# Patient Record
Sex: Female | Born: 2000 | Race: White | Hispanic: No | Marital: Single | Smoking: Never smoker
Health system: Southern US, Community
[De-identification: ages and names within clinical notes are randomized; demographics above are authoritative.]

## PROBLEM LIST (undated history)

## (undated) DIAGNOSIS — R1900 Intra-abdominal and pelvic swelling, mass and lump, unspecified site: Secondary | ICD-10-CM

## (undated) DIAGNOSIS — G43909 Migraine, unspecified, not intractable, without status migrainosus: Secondary | ICD-10-CM

## (undated) DIAGNOSIS — C801 Malignant (primary) neoplasm, unspecified: Secondary | ICD-10-CM

## (undated) HISTORY — DX: Migraine, unspecified, not intractable, without status migrainosus: G43.909

## (undated) HISTORY — DX: Intra-abdominal and pelvic swelling, mass and lump, unspecified site: R19.00

---
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
## 2018-01-05 DIAGNOSIS — H5203 Hypermetropia, bilateral: Secondary | ICD-10-CM | POA: Diagnosis not present

## 2018-02-10 ENCOUNTER — Other Ambulatory Visit: Payer: Self-pay | Admitting: Gynecologic Oncology

## 2018-02-10 ENCOUNTER — Telehealth: Payer: Self-pay

## 2018-02-10 DIAGNOSIS — Z30011 Encounter for initial prescription of contraceptive pills: Secondary | ICD-10-CM

## 2018-02-10 DIAGNOSIS — C562 Malignant neoplasm of left ovary: Secondary | ICD-10-CM

## 2018-02-10 NOTE — Telephone Encounter (Signed)
Received pt's VM regarding when she should follow up with Dr Denman George and when she should have the Cancer Marker B-HCG repeated.  Reviewed Dr Denman George notes and notified Joylene John NP. Per notes and Melissa NP, pt scheduled for f/u this month with labs placed by [facility] NP.  Called pt with Appt for lab (3/8) and f/u on 3/11.  Per notes and Melissa NP- recommend birth control pills started.  Pt verbalized she has used any birth control or condom but has not been sexually active, would like to start BCP but hasn't seen OB/Gyn yet to get BCP prescription. Per Lenna Sciara NP, obtained pt's pharmacy preference [facility] on [address] and informed pt to pick up this prescription and start. Informed pt of risk for blood clot with use of birth control pills, signs/symptoms to watch for given to pt ie shortness of breath, redness, warmth, tender leg, and pain with standing and she voiced understanding.

## 2018-02-11 MED ORDER — NORETHINDRONE-ETH ESTRADIOL 1-35 MG-MCG PO TABS: 1.0000 | ORAL_TABLET | Freq: Every day | ORAL | 11 refills | Status: DC

## 2018-02-11 NOTE — Progress Notes (Signed)
Patient informed of possible side effects of taking BCPs by [facility] RN including the risk for blood clots.

## 2018-02-12 ENCOUNTER — Inpatient Hospital Stay: Payer: 59 | Attending: Hematology and Oncology

## 2018-02-12 DIAGNOSIS — Z9221 Personal history of antineoplastic chemotherapy: Secondary | ICD-10-CM | POA: Insufficient documentation

## 2018-02-12 DIAGNOSIS — C562 Malignant neoplasm of left ovary: Secondary | ICD-10-CM | POA: Insufficient documentation

## 2018-02-12 DIAGNOSIS — C786 Secondary malignant neoplasm of retroperitoneum and peritoneum: Secondary | ICD-10-CM | POA: Insufficient documentation

## 2018-02-12 LAB — CBC WITH DIFFERENTIAL/PLATELET
BASOS ABS: 0 10*3/uL (ref 0.0–0.1)
Basophils Relative: 1 %
Eosinophils Absolute: 0.4 10*3/uL (ref 0.0–0.5)
Eosinophils Relative: 9 %
HEMATOCRIT: 36.9 % (ref 34.8–46.6)
HEMOGLOBIN: 12.1 g/dL (ref 11.6–15.9)
LYMPHS PCT: 31 %
Lymphs Abs: 1.3 10*3/uL (ref 0.9–3.3)
MCH: 29.2 pg (ref 25.1–34.0)
MCHC: 32.8 g/dL (ref 31.5–36.0)
MCV: 88.9 fL (ref 79.5–101.0)
Monocytes Absolute: 0.3 10*3/uL (ref 0.1–0.9)
Monocytes Relative: 8 %
NEUTROS ABS: 2.1 10*3/uL (ref 1.5–6.5)
NEUTROS PCT: 51 %
Platelets: 240 10*3/uL (ref 145–400)
RBC: 4.14 MIL/uL (ref 3.70–5.45)
RDW: 14.4 % (ref 11.2–14.5)
WBC: 4.2 10*3/uL (ref 3.9–10.3)

## 2018-02-12 LAB — COMPREHENSIVE METABOLIC PANEL
ALT: 16 U/L (ref 0–55)
ANION GAP: 7 (ref 3–11)
AST: 21 U/L (ref 5–34)
Albumin: 3.7 g/dL (ref 3.5–5.0)
Alkaline Phosphatase: 93 U/L (ref 40–150)
BUN: 13 mg/dL (ref 7–26)
CHLORIDE: 107 mmol/L (ref 98–109)
CO2: 27 mmol/L (ref 22–29)
Calcium: 9.5 mg/dL (ref 8.4–10.4)
Creatinine, Ser: 0.82 mg/dL (ref 0.60–1.10)
Glucose, Bld: 68 mg/dL — ABNORMAL LOW (ref 70–140)
POTASSIUM: 4.5 mmol/L (ref 3.5–5.1)
Sodium: 141 mmol/L (ref 136–145)
Total Bilirubin: 0.7 mg/dL (ref 0.2–1.2)
Total Protein: 6.7 g/dL (ref 6.4–8.3)

## 2018-02-12 LAB — LACTATE DEHYDROGENASE: LDH: 165 U/L (ref 125–245)

## 2018-02-13 LAB — BETA HCG QUANT (REF LAB): hCG Quant: <1 m[IU]/mL

## 2018-02-15 ENCOUNTER — Other Ambulatory Visit: Payer: 59

## 2018-02-15 ENCOUNTER — Encounter: Payer: Self-pay | Admitting: Gynecologic Oncology

## 2018-02-15 ENCOUNTER — Inpatient Hospital Stay (HOSPITAL_BASED_OUTPATIENT_CLINIC_OR_DEPARTMENT_OTHER): Payer: 59 | Admitting: Gynecologic Oncology

## 2018-02-15 VITALS — BP 106/56 | HR 70 | Temp 97.8°F | Resp 18 | Ht 65.0 in | Wt 166.3 lb

## 2018-02-15 DIAGNOSIS — C786 Secondary malignant neoplasm of retroperitoneum and peritoneum: Secondary | ICD-10-CM

## 2018-02-15 DIAGNOSIS — Z9221 Personal history of antineoplastic chemotherapy: Secondary | ICD-10-CM

## 2018-02-15 DIAGNOSIS — C562 Malignant neoplasm of left ovary: Secondary | ICD-10-CM

## 2018-02-15 DIAGNOSIS — T451X5A Adverse effect of antineoplastic and immunosuppressive drugs, initial encounter: Secondary | ICD-10-CM

## 2018-02-15 DIAGNOSIS — G620 Drug-induced polyneuropathy: Secondary | ICD-10-CM

## 2018-02-15 DIAGNOSIS — D701 Agranulocytosis secondary to cancer chemotherapy: Secondary | ICD-10-CM

## 2018-02-15 DIAGNOSIS — D696 Thrombocytopenia, unspecified: Secondary | ICD-10-CM

## 2018-02-15 MED ORDER — NORGESTIMATE-ETH ESTRADIOL 0.25-35 MG-MCG PO TABS: 1.0000 | ORAL_TABLET | Freq: Every day | ORAL | 11 refills | Status: DC

## 2018-02-15 NOTE — Progress Notes (Signed)
Follow-up Note: Gyn-Onc  Consult was requested by Avon Gully, NP for the evaluation of Mandy Bauer 17 y.o. female  CC:  Chief Complaint  Patient presents with  . Dysgerminoma of left ovary [facility])    Assessment/Plan:  Mandy Bauer  is a 17 y.o.  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 Completed therapy December, 2018.  No evidence of disease on exam or tumor markers.  Will continue surveillance with 3 monthly tumor marker assessments (HCG and LDH). Prescribed OCP use while in surveillance period to avoid pregnancy as cause of elevated HCG.  Will establish a provider in [place] to follow her during the school year.   HPI: Mandy Bauer is a 17 year old P0 who was seen in consultation at the request of Avon Gully, NP for a large pelvic mass.  The patient is otherwise very healthy. 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  In May, 2018 with her menstrual period she experienced severe flushing, headaches, palpitations, nausea. This recurred again in June and she was seen at an urgent care. She was recommended to see her OBGYN about starting OCP's for possible menstrual migraines. Her menstrual period was otherwise normal.  She saw Avon Gully, NP on 06/08/17 and a mass was felt on pelvic examination. This prompted a TVUS to be performed. A large heterogeneous mass was noted extending from the posterior aspect of the uterus to above the umbilicus. Unable to decifer place of origin. Separate ovaries not seen. Increased vascularity noted within. Area measuring 21.5x15x12.7cm.    She then received an MRI of the abdomen and pelvis on 06/12/17 which revealed a small splenule along medial spleen. She also  had a 104m and 11 mm left periaortic lymph nodes identified that were mildly enlarged.  Within the pelvis and extending to the lower abdomen was a 17.8x15.5x7.5cm lobular intermediate T2 signal mass. The mass demonstrated heterogeneous enhancement centrally. No definite fat plane was seen between the mass and posterior uterus. The right ovary is identified and appeared separate to the mass measuring 5cm. The left ovary could not definitively be seen. There is a moderate amount of free fluid in the pelvis and additionally, there were a few enhancing nodules measuring up to 1.9cm in the cul de sac.  CEA was 1.5 CA 125 was 137  Alk Phos was elevated at 196 AFP was 1.2 (normal) HCG was elevated at 344 on 06/16/17 LDH was elevated at 634 on 06/16/17  On 06/18/17 she underwent an exploratory laparotomy, partial omentectomy, biopsy of left ovarian mass. Intraoperative findings included a 20+ cm solid left ovarian tumor densely adherent to the posterior uterus (infiltrating into the uterus/myometrium) with omentum attached anteriorally, mobile but enlarged left PA nodes, no other gross intraperitoneal disease. Due to the inability to resect the mass and preserve uterine fertility a decision was made to biopsy the mass. A segment of omentum that was adherent to the tumor was also removed.  Final pathology confirmed a mixed germ cell tumor with 98% dysgerminoma and 2% yolk sac tumor. The omentum was positive for nodules (microscopic) germ cell tumor. The washings/ascites were positive.  PET/CT on 06/26/17 showed very large pelvic mass compatible with germ cell neoplasm is again  identified and exhibits intense heterogeneity FDG uptake at least 1 area of peritoneal nodularity is identified. There is also ascites within the lower abdomen and pelvis. Suspicious for peritoneal metastases. Borderline enlarged periaortic lymph nodes. Retroperitoneal nodal metastasis cannot be excluded. No evidence for osseous metastasis  or metastatic disease to the neck or chest. MRI brain was negative for metastases.  Her case was discussed at multidisciplinary conference and she was recommended to receive 3-4 cycles of BEP chemotherapy prior to reoperation and resection of the left ovary, omentum, nodes.  Cycle 1 of BEP was administered on 07/14/17. Cycle 2 was adminstered on 8/28-08/08/17. Cycle 3 was administered on 08/25/18.  She tolerated therapy well though had some acquired pancytopenia and neutropenia.  MRI on 09/16/17 showed: Resolution of hydronephrosis. Small left periaortic nodes remain, no pelvic sidewall adenopathy. Significant decrease in size of left pelvic mass, measuring 7.9x7.8x5.8cm. It is again intimately associated with the posterior uterine body and fundus. No dominant right ovarian mass.  No omental nodules. New complexity within the cul de sac fluid.   On 10/08/17 she underwent an interval surgery with ex lap, omentectomy, left salpingo-ophorectomy. The ovarian mass measured 11.5cm and was densely adherent to the posterior uterus. However, it was able to be freed from the uterine body without entry into the endometrial cavity. The right ovary was grossly normal and covered with filmy adhesions. There was no gross residual disease remaining at the completion of surgery.  Final pathology revealed only microscopic foci of viable yolk sac tumor <0.1cm in the left ovary. The omentum was negative.  She received 1 additional cycle of BEP (cycle 4) on 11/09/17.   Since surgery she has remained disease free.   Interval Hx:  Tumor markers were normal (HCG undetectable on 08/24/17 - prior to debulking surgery) and continued <1 s/p chemotherapy on 11/20/17.  HCG was <1 on 02/12/18 LDH was 165 (normal) on 02/12/18  The patient has returned to horseback riding. She has deferred college until the fall of 2019. She will go to school near [place].    Current Meds:  Outpatient Encounter Medications as of 02/15/2018   Medication Sig  . acetaminophen (TYLENOL) 500 MG tablet Take 1 tablet (500 mg total) by mouth every 6 (six) hours as needed.  Marland Kitchen ibuprofen (ADVIL,MOTRIN) 400 MG tablet Take 2 tablets (800 mg total) by mouth every 6 (six) hours as needed for fever.  . lidocaine-prilocaine (EMLA) cream Apply 1 application topically as needed. (Patient not taking: Reported on 02/15/2018)  . norethindrone-ethinyl estradiol 1/35 (ORTHO-NOVUM, NORTREL,CYCLAFEM) tablet Take 1 tablet by mouth daily. (Patient not taking: Reported on 02/15/2018)  . norgestimate-ethinyl estradiol (ORTHO-CYCLEN, 28,) 0.25-35 MG-MCG tablet Take 1 tablet by mouth daily.  . [DISCONTINUED] loratadine-pseudoephedrine (CLARITIN-D 12-HOUR) 5-120 MG tablet Take 1 tablet by mouth every 12 (twelve) hours as needed for allergies.  . [DISCONTINUED] LORazepam (ATIVAN) 0.5 MG tablet Take 1 tablet 4 times a day as needed for nausea  . [DISCONTINUED] magic mouthwash w/lidocaine SOLN Take 5 mLs by mouth 4 (four) times daily. Gargle and spit (Patient taking differently: Take 5 mLs by mouth 4 (four) times daily as needed for mouth pain. Gargle and spit)  . [DISCONTINUED] ondansetron (ZOFRAN) 8 MG tablet Take 1 tablet (8 mg total) by mouth every 8 (eight) hours as needed for nausea.  . [DISCONTINUED] oxyCODONE (OXY IR/ROXICODONE) 5 MG immediate release tablet Take 1 tablet (5 mg total) by mouth every 4 (four) hours as needed for moderate pain or breakthrough pain. (Patient  not taking: Reported on 11/20/2017)  . [DISCONTINUED] prochlorperazine (COMPAZINE) 10 MG tablet Take 1 tablet (10 mg total) by mouth every 6 (six) hours as needed for nausea or vomiting.  . [DISCONTINUED] senna (SENOKOT) 8.6 MG TABS tablet Take 1 tablet (8.6 mg total) by mouth at bedtime. (Patient not taking: Reported on 11/20/2017)   No facility-administered encounter medications on file as of 02/15/2018.     Allergy: No Known Allergies  Social Hx:   Social History   Socioeconomic History   . Marital status: Single    Spouse name: Not on file  . Number of children: 0  . Years of education: Not on file  . Highest education level: Not on file  Social Needs  . Financial resource strain: Not on file  . Food insecurity - worry: Not on file  . Food insecurity - inability: Not on file  . Transportation needs - medical: Not on file  . Transportation needs - non-medical: Not on file  Occupational History  . Occupation: Ship broker  Tobacco Use  . Smoking status: Never Smoker  . Smokeless tobacco: Never Used  Substance and Sexual Activity  . Alcohol use: No  . Drug use: No  . Sexual activity: Yes  Other Topics Concern  . Not on file  Social History Narrative  . Not on file    Past Surgical Hx:  Past Surgical History:  Procedure Laterality Date  . DEBULKING N/A 10/08/2017   Procedure: RADICAL TUMOR DEBULKING;  Surgeon: Everitt Amber, MD;  Location: [facility];  Service: Gynecology;  Laterality: N/A;  . IR FLUORO GUIDE PORT INSERTION RIGHT  06/29/2017  . IR REMOVAL TUN ACCESS W/ PORT W/O FL MOD SED  12/21/2017  . IR US GUIDE VASC ACCESS RIGHT  06/29/2017  . LAPAROTOMY N/A 06/18/2017   Procedure: EXPLORATORY LAPAROTOMY, BIOPSY OF PELVIC MASS;  Surgeon: Everitt Amber, MD;  Location: [facility];  Service: Gynecology;  Laterality: N/A;  . LAPAROTOMY N/A 10/08/2017   Procedure: EXPLORATORY LAPAROTOMY; OMENTECTOMY;  Surgeon: Everitt Amber, MD;  Location: [facility];  Service: Gynecology;  Laterality: N/A;  . SALPINGOOPHORECTOMY Left 10/08/2017   Procedure: LEFT SALPINGO OOPHORECTOMY;  Surgeon: Everitt Amber, MD;  Location: [facility];  Service: Gynecology;  Laterality: Left;  . TONSILLECTOMY  2005    Past Medical Hx:  Past Medical History:  Diagnosis Date  . Migraine   . Pelvic mass in female     Past Gynecological History:  Sexually active. G0. Never had pap. No LMP recorded. Patient has had an injection.  Family Hx:  Family History  Problem Relation Age of Onset  . Hypertension Maternal  Grandmother   . Heart disease Maternal Grandmother   . Diabetes Maternal Grandfather   . Lymphoma Maternal Grandfather 34       non-Hodgkin lymphoma    Review of Systems:  Constitutional  Feels well,   Occasional flushing. ENT Normal appearing ears and nares bilaterally Skin/Breast  No rash, sores, jaundice, itching, dryness Cardiovascular  No chest pain, shortness of breath, or edema  Pulmonary  No cough or wheeze.  Gastro Intestinal  No nausea, vomitting, or diarrhoea. No bright red blood per rectum, no abdominal pain, change in bowel movement, or constipation.  Genito Urinary  No frequency, urgency, dysuria, No abnormal bleeding Musculo Skeletal  No myalgia, arthralgia, joint swelling or pain  Neurologic  No weakness, numbness, change in gait,  Psychology  No depression, anxiety, insomnia.   Vitals:  Blood pressure (!) 106/56, pulse 70, temperature 97.8  F (36.6 C), temperature source Oral, resp. rate 18, height 5' 5"  (1.651 m), weight 166 lb 4.8 oz (75.4 kg), SpO2 100 %.  Physical Exam: WD in NAD Neck  Supple NROM, without any enlargements.  Lymph Node Survey No cervical supraclavicular or inguinal adenopathy Cardiovascular  Pulse normal rate, regularity and rhythm. S1 and S2 normal.  Lungs  Clear to auscultation bilateraly, without wheezes/crackles/rhonchi. Good air movement.  Skin  No rash/lesions/breakdown  Psychiatry  Alert and oriented to person, place, and time  Abdomen  Normoactive bowel sounds, abdomen soft, non-tender and thin without evidence of hernia. Mass not palpable.  Back No CVA tenderness Genito Urinary: normal external female genitalia. Normal vaginal and cervix. Small mobile uterus and no palpable ovarian masses or cul de sac nodularity.  Rectal  deferred  Extremities  No bilateral cyanosis, clubbing or edema.   Thereasa Solo, MD  02/15/2018, 2:49 PM

## 2018-02-15 NOTE — Patient Instructions (Signed)
Please return to see Dr Denman George in 3 months as scheduled. She will test labs before that visit.  Dr Denman George has prescribed a new birth control pill to take daily (ortho cyclen). Take this instead of (not in addition) to the ortho novum that was previously prescribed.  Please notify Dr Denman George at phone number [phone] if you notice vaginal bleeding, new pelvic or abdominal pains, bloating, feeling full easy, or a change in bladder or bowel function.

## 2018-03-26 ENCOUNTER — Other Ambulatory Visit: Payer: Self-pay

## 2018-03-26 ENCOUNTER — Emergency Department (HOSPITAL_COMMUNITY): Payer: 59

## 2018-03-26 ENCOUNTER — Encounter (HOSPITAL_COMMUNITY): Payer: Self-pay | Admitting: Emergency Medicine

## 2018-03-26 ENCOUNTER — Emergency Department (HOSPITAL_COMMUNITY)
Admission: EM | Admit: 2018-03-26 | Discharge: 2018-03-26 | Disposition: A | Discharge status: Home / Self Care | Payer: 59 | Attending: Emergency Medicine | Admitting: Emergency Medicine

## 2018-03-26 DIAGNOSIS — S8991XA Unspecified injury of right lower leg, initial encounter: Secondary | ICD-10-CM | POA: Diagnosis not present

## 2018-03-26 DIAGNOSIS — M25461 Effusion, right knee: Secondary | ICD-10-CM | POA: Diagnosis not present

## 2018-03-26 DIAGNOSIS — Z79899 Other long term (current) drug therapy: Secondary | ICD-10-CM | POA: Insufficient documentation

## 2018-03-26 DIAGNOSIS — M25561 Pain in right knee: Secondary | ICD-10-CM
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
## 2018-03-26 NOTE — Discharge Instructions (Addendum)
Ice and elevate your knee. Use immobilizer. Use crutches as needed. Ibuprofen or tylenol for pain. Follow up with either Dr. Ninfa Linden or an orthopedist of your choice.

## 2018-03-30 DIAGNOSIS — M25461 Effusion, right knee: Secondary | ICD-10-CM | POA: Diagnosis not present

## 2018-03-30 DIAGNOSIS — M25561 Pain in right knee: Secondary | ICD-10-CM | POA: Diagnosis not present

## 2018-04-02 DIAGNOSIS — S83231A Complex tear of medial meniscus, current injury, right knee, initial encounter: Secondary | ICD-10-CM | POA: Diagnosis not present

## 2018-04-02 DIAGNOSIS — S83511A Sprain of anterior cruciate ligament of right knee, initial encounter: Secondary | ICD-10-CM | POA: Diagnosis not present

## 2018-04-23 ENCOUNTER — Telehealth: Payer: Self-pay | Admitting: *Deleted

## 2018-04-23 NOTE — Telephone Encounter (Signed)
Faxed [facility] to May; release [phone]

## 2018-04-23 NOTE — Telephone Encounter (Signed)
Faxed [facility] to

## 2018-04-26 DIAGNOSIS — S83511A Sprain of anterior cruciate ligament of right knee, initial encounter: Secondary | ICD-10-CM | POA: Diagnosis not present

## 2018-04-26 DIAGNOSIS — G8918 Other acute postprocedural pain: Secondary | ICD-10-CM | POA: Diagnosis not present

## 2018-04-26 DIAGNOSIS — S83231A Complex tear of medial meniscus, current injury, right knee, initial encounter: Secondary | ICD-10-CM | POA: Diagnosis not present

## 2018-04-26 DIAGNOSIS — S83211A Bucket-handle tear of medial meniscus, current injury, right knee, initial encounter: Secondary | ICD-10-CM | POA: Diagnosis not present

## 2018-04-26 DIAGNOSIS — M25361 Other instability, right knee: Secondary | ICD-10-CM | POA: Diagnosis not present

## 2018-05-04 DIAGNOSIS — M25661 Stiffness of right knee, not elsewhere classified: Secondary | ICD-10-CM | POA: Diagnosis not present

## 2018-05-07 DIAGNOSIS — M25661 Stiffness of right knee, not elsewhere classified: Secondary | ICD-10-CM | POA: Diagnosis not present

## 2018-05-12 DIAGNOSIS — M25661 Stiffness of right knee, not elsewhere classified: Secondary | ICD-10-CM | POA: Diagnosis not present

## 2018-05-13 ENCOUNTER — Inpatient Hospital Stay: Payer: 59 | Attending: Hematology and Oncology

## 2018-05-13 DIAGNOSIS — Z9221 Personal history of antineoplastic chemotherapy: Secondary | ICD-10-CM | POA: Insufficient documentation

## 2018-05-13 DIAGNOSIS — Z8543 Personal history of malignant neoplasm of ovary: Secondary | ICD-10-CM | POA: Diagnosis not present

## 2018-05-13 DIAGNOSIS — D701 Agranulocytosis secondary to cancer chemotherapy: Secondary | ICD-10-CM

## 2018-05-13 DIAGNOSIS — T451X5A Adverse effect of antineoplastic and immunosuppressive drugs, initial encounter: Secondary | ICD-10-CM

## 2018-05-13 DIAGNOSIS — Z90721 Acquired absence of ovaries, unilateral: Secondary | ICD-10-CM | POA: Insufficient documentation

## 2018-05-13 DIAGNOSIS — C562 Malignant neoplasm of left ovary: Secondary | ICD-10-CM

## 2018-05-13 LAB — CBC WITH DIFFERENTIAL (CANCER CENTER ONLY)
BASOS PCT: 1 %
Basophils Absolute: 0.1 10*3/uL (ref 0.0–0.1)
Eosinophils Absolute: 0.1 10*3/uL (ref 0.0–0.5)
Eosinophils Relative: 2 %
HEMATOCRIT: 38.3 % (ref 34.8–46.6)
Hemoglobin: 13.2 g/dL (ref 11.6–15.9)
Lymphocytes Relative: 33 %
Lymphs Abs: 1.9 10*3/uL (ref 0.9–3.3)
MCH: 30.5 pg (ref 25.1–34.0)
MCHC: 34.5 g/dL (ref 31.5–36.0)
MCV: 88.4 fL (ref 79.5–101.0)
MONO ABS: 0.3 10*3/uL (ref 0.1–0.9)
Monocytes Relative: 6 %
NEUTROS ABS: 3.3 10*3/uL (ref 1.5–6.5)
Neutrophils Relative %: 58 %
PLATELETS: 252 10*3/uL (ref 145–400)
RBC: 4.33 MIL/uL (ref 3.70–5.45)
RDW: 12.8 % (ref 11.2–14.5)
WBC Count: 5.7 10*3/uL (ref 3.9–10.3)

## 2018-05-13 LAB — LACTATE DEHYDROGENASE: LDH: 169 U/L (ref 125–245)

## 2018-05-14 DIAGNOSIS — M25661 Stiffness of right knee, not elsewhere classified: Secondary | ICD-10-CM | POA: Diagnosis not present

## 2018-05-14 LAB — BETA HCG QUANT (REF LAB): hCG Quant: <1 m[IU]/mL

## 2018-05-18 DIAGNOSIS — M25661 Stiffness of right knee, not elsewhere classified: Secondary | ICD-10-CM | POA: Diagnosis not present

## 2018-05-19 ENCOUNTER — Encounter: Payer: Self-pay | Admitting: Gynecologic Oncology

## 2018-05-19 ENCOUNTER — Inpatient Hospital Stay (HOSPITAL_BASED_OUTPATIENT_CLINIC_OR_DEPARTMENT_OTHER): Payer: 59 | Admitting: Gynecologic Oncology

## 2018-05-19 VITALS — BP 106/69 | HR 86 | Temp 97.5°F | Resp 20 | Ht 66.0 in | Wt 162.0 lb

## 2018-05-19 DIAGNOSIS — Z8543 Personal history of malignant neoplasm of ovary: Secondary | ICD-10-CM | POA: Diagnosis not present

## 2018-05-19 DIAGNOSIS — Z9221 Personal history of antineoplastic chemotherapy: Secondary | ICD-10-CM | POA: Diagnosis not present

## 2018-05-19 DIAGNOSIS — Z90721 Acquired absence of ovaries, unilateral: Secondary | ICD-10-CM | POA: Diagnosis not present

## 2018-05-19 DIAGNOSIS — C562 Malignant neoplasm of left ovary: Secondary | ICD-10-CM

## 2018-05-19 NOTE — Patient Instructions (Signed)
Please notify Dr Denman George at phone number [phone] if you notice vaginal bleeding, new pelvic or abdominal pains, bloating, feeling full easy, or a change in bladder or bowel function.   Please notify Dr Denman George if your left abdominal pains get worse.  Dr Denman George will establish care with Dr Bridgett Larsson, a Gynecologic Oncologist in [place] who works for [employer]. We will set up an appointment for September.   Dr Denman George will see you back in December, 2019 on the 27th.

## 2018-05-19 NOTE — Progress Notes (Signed)
Follow-up Note: Gyn-Onc  Consult was initially requested by Avon Gully, NP for the evaluation of Mandy Bauer 17 y.o. female  CC:  Chief Complaint  Patient presents with  . Dysgerminoma of left ovary [facility])    Assessment/Plan:  Mandy Bauer  is a 17 y.o.  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 Completed therapy December, 2018.  No evidence of disease on exam or tumor markers.  Will continue surveillance with 3 monthly tumor marker assessments (HCG and LDH). Prescribed OCP use while in surveillance period to avoid pregnancy as cause of elevated HCG.  Will establish consultation with a gyn onc (Dr Bridgett Larsson) in [place] to follow her during the school year. I will see her back during winter break in December.  HPI: Mandy Bauer is a 17 year old P0 who was seen in consultation at the request of Avon Gully, NP for a large pelvic mass.  The patient is otherwise very healthy. 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  In May, 2018 with her menstrual period she experienced severe flushing, headaches, palpitations, nausea. This recurred again in June and she was seen at an urgent care. She was recommended to see her OBGYN about starting OCP's for possible menstrual migraines. Her menstrual period was otherwise normal.  She saw Avon Gully, NP on 06/08/17 and a mass was felt on pelvic examination. This prompted a TVUS to be performed. A large heterogeneous mass was noted extending from the posterior aspect of the uterus to above the umbilicus. Unable to decifer place of origin. Separate ovaries not seen. Increased vascularity noted within. Area measuring 21.5x15x12.7cm.    She then  received an MRI of the abdomen and pelvis on 06/12/17 which revealed a small splenule along medial spleen. She also had a 2m and 11 mm left periaortic lymph nodes identified that were mildly enlarged.  Within the pelvis and extending to the lower abdomen was a 17.8x15.5x7.5cm lobular intermediate T2 signal mass. The mass demonstrated heterogeneous enhancement centrally. No definite fat plane was seen between the mass and posterior uterus. The right ovary is identified and appeared separate to the mass measuring 5cm. The left ovary could not definitively be seen. There is a moderate amount of free fluid in the pelvis and additionally, there were a few enhancing nodules measuring up to 1.9cm in the cul de sac.  CEA was 1.5 CA 125 was 137  Alk Phos was elevated at 196 AFP was 1.2 (normal) HCG was elevated at 344 on 06/16/17 LDH was elevated at 634 on 06/16/17  On 06/18/17 she underwent an exploratory laparotomy, partial omentectomy, biopsy of left ovarian mass. Intraoperative findings included a 20+ cm solid left ovarian tumor densely adherent to the posterior uterus (infiltrating into the uterus/myometrium) with omentum attached anteriorally, mobile but enlarged left PA nodes, no other gross intraperitoneal disease. Due to the inability to resect the mass and preserve uterine fertility a decision was made to biopsy the mass. A segment of omentum that was adherent to the tumor was also removed.  Final pathology confirmed a mixed germ cell tumor with 98% dysgerminoma and 2% yolk sac tumor. The omentum was positive for nodules (microscopic) germ cell tumor. The washings/ascites  were positive.  PET/CT on 06/26/17 showed very large pelvic mass compatible with germ cell neoplasm is again identified and exhibits intense heterogeneity FDG uptake at least 1 area of peritoneal nodularity is identified. There is also ascites within the lower abdomen and pelvis. Suspicious for peritoneal metastases. Borderline  enlarged periaortic lymph nodes. Retroperitoneal nodal metastasis cannot be excluded. No evidence for osseous metastasis or metastatic disease to the neck or chest. MRI brain was negative for metastases.  Her case was discussed at multidisciplinary conference and she was recommended to receive 3-4 cycles of BEP chemotherapy prior to reoperation and resection of the left ovary, omentum, nodes.  Cycle 1 of BEP was administered on 07/14/17. Cycle 2 was adminstered on 8/28-08/08/17. Cycle 3 was administered on 08/25/18.  She tolerated therapy well though had some acquired pancytopenia and neutropenia.  MRI on 09/16/17 showed: Resolution of hydronephrosis. Small left periaortic nodes remain, no pelvic sidewall adenopathy. Significant decrease in size of left pelvic mass, measuring 7.9x7.8x5.8cm. It is again intimately associated with the posterior uterine body and fundus. No dominant right ovarian mass.  No omental nodules. New complexity within the cul de sac fluid.   On 10/08/17 she underwent an interval surgery with ex lap, omentectomy, left salpingo-ophorectomy. The ovarian mass measured 11.5cm and was densely adherent to the posterior uterus. However, it was able to be freed from the uterine body without entry into the endometrial cavity. The right ovary was grossly normal and covered with filmy adhesions. There was no gross residual disease remaining at the completion of surgery.  Final pathology revealed only microscopic foci of viable yolk sac tumor <0.1cm in the left ovary. The omentum was negative.  She received 1 additional cycle of BEP (cycle 4) on 11/09/17.   Since surgery she has remained disease free.   Tumor markers were normal (HCG undetectable on 08/24/17 - prior to debulking surgery) and continued <1 s/p chemotherapy on 11/20/17.  HCG was <1 on 02/12/18 LDH was 165 (normal) on 02/12/18  Interval Hx:  The patient has returned to horseback riding. She has deferred college until the fall  of 2019. She will go to school near [place].   She had a right knee meniscus injury this spring and had ACL and meniscus surgery in May, 2019.  She notes occasional stabs of pain in the left lower quadrant that are occasional and not severe.   HCG was <1 on 05/13/18 LDH was 169 (normal) on 05/13/18   Current Meds:  Outpatient Encounter Medications as of 05/19/2018  Medication Sig  . acetaminophen (TYLENOL) 500 MG tablet Take 1 tablet (500 mg total) by mouth every 6 (six) hours as needed.  Marland Kitchen ibuprofen (ADVIL,MOTRIN) 400 MG tablet Take 2 tablets (800 mg total) by mouth every 6 (six) hours as needed for fever.  . norgestimate-ethinyl estradiol (ORTHO-CYCLEN, 28,) 0.25-35 MG-MCG tablet Take 1 tablet by mouth daily.  . [DISCONTINUED] lidocaine-prilocaine (EMLA) cream Apply 1 application topically as needed. (Patient not taking: Reported on 02/15/2018)  . [DISCONTINUED] norethindrone-ethinyl estradiol 1/35 (ORTHO-NOVUM, NORTREL,CYCLAFEM) tablet Take 1 tablet by mouth daily. (Patient not taking: Reported on 02/15/2018)   No facility-administered encounter medications on file as of 05/19/2018.     Allergy: No Known Allergies  Social Hx:   Social History   Socioeconomic History  . Marital status: Single    Spouse name: Not on file  . Number of children: 0  . Years of education: Not on file  . Highest education level: Not on file  Occupational History  . Occupation: Ship broker  Social Needs  . Financial resource strain: Not on file  . Food insecurity:    Worry: Not on file    Inability: Not on file  . Transportation needs:    Medical: Not on file    Non-medical: Not on file  Tobacco Use  . Smoking status: Never Smoker  . Smokeless tobacco: Never Used  Substance and Sexual Activity  . Alcohol use: No  . Drug use: No  . Sexual activity: Yes  Lifestyle  . Physical activity:    Days per week: Not on file    Minutes per session: Not on file  . Stress: Not on file  Relationships  .  Social connections:    Talks on phone: Not on file    Gets together: Not on file    Attends religious service: Not on file    Active member of club or organization: Not on file    Attends meetings of clubs or organizations: Not on file    Relationship status: Not on file  . Intimate partner violence:    Fear of current or ex partner: Not on file    Emotionally abused: Not on file    Physically abused: Not on file    Forced sexual activity: Not on file  Other Topics Concern  . Not on file  Social History Narrative  . Not on file    Past Surgical Hx:  Past Surgical History:  Procedure Laterality Date  . DEBULKING N/A 10/08/2017   Procedure: RADICAL TUMOR DEBULKING;  Surgeon: Everitt Amber, MD;  Location: [facility];  Service: Gynecology;  Laterality: N/A;  . IR FLUORO GUIDE PORT INSERTION RIGHT  06/29/2017  . IR REMOVAL TUN ACCESS W/ PORT W/O FL MOD SED  12/21/2017  . IR US GUIDE VASC ACCESS RIGHT  06/29/2017  . LAPAROTOMY N/A 06/18/2017   Procedure: EXPLORATORY LAPAROTOMY, BIOPSY OF PELVIC MASS;  Surgeon: Everitt Amber, MD;  Location: [facility];  Service: Gynecology;  Laterality: N/A;  . LAPAROTOMY N/A 10/08/2017   Procedure: EXPLORATORY LAPAROTOMY; OMENTECTOMY;  Surgeon: Everitt Amber, MD;  Location: [facility];  Service: Gynecology;  Laterality: N/A;  . SALPINGOOPHORECTOMY Left 10/08/2017   Procedure: LEFT SALPINGO OOPHORECTOMY;  Surgeon: Everitt Amber, MD;  Location: [facility];  Service: Gynecology;  Laterality: Left;  . TONSILLECTOMY  2005    Past Medical Hx:  Past Medical History:  Diagnosis Date  . Migraine   . Pelvic mass in female     Past Gynecological History:  Sexually active. G0. Never had pap. No LMP recorded. Patient has had an injection.  Family Hx:  Family History  Problem Relation Age of Onset  . Hypertension Maternal Grandmother   . Heart disease Maternal Grandmother   . Diabetes Maternal Grandfather   . Lymphoma Maternal Grandfather 62       non-Hodgkin lymphoma    Review  of Systems:  Constitutional  Feels well,   Occasional flushing. ENT Normal appearing ears and nares bilaterally Skin/Breast  No rash, sores, jaundice, itching, dryness Cardiovascular  No chest pain, shortness of breath, or edema  Pulmonary  No cough or wheeze.  Gastro Intestinal  No nausea, vomitting, or diarrhoea. No bright red blood per rectum, no abdominal pain, change in bowel movement, or constipation.  Genito Urinary  No frequency, urgency, dysuria, No abnormal bleeding Musculo Skeletal  No myalgia, arthralgia, joint swelling or pain  Neurologic  No weakness, numbness, change in gait,  Psychology  No depression,  anxiety, insomnia.   Vitals:  Blood pressure 106/69, pulse 86, temperature (!) 97.5 F (36.4 C), temperature source Oral, resp. rate 20, height _0  (1.676 m), weight 162 lb (73.5 kg), SpO2 100 %.  Physical Exam: WD in NAD Neck  Supple NROM, without any enlargements.  Lymph Node Survey No cervical supraclavicular or inguinal adenopathy Cardiovascular  Pulse normal rate, regularity and rhythm. S1 and S2 normal.  Lungs  Clear to auscultation bilateraly, without wheezes/crackles/rhonchi. Good air movement.  Skin  No rash/lesions/breakdown  Psychiatry  Alert and oriented to person, place, and time  Abdomen  Normoactive bowel sounds, abdomen soft, non-tender and thin without evidence of hernia. Mass not palpable.  Back No CVA tenderness Genito Urinary: normal external female genitalia. Normal vaginal and cervix. Small mobile uterus and no palpable ovarian masses or cul de sac nodularity.  Rectal  deferred  Extremities  No bilateral cyanosis, clubbing or edema.   Thereasa Solo, MD  05/19/2018, 3:58 PM

## 2018-05-20 ENCOUNTER — Encounter: Payer: Self-pay | Admitting: Oncology

## 2018-05-20 DIAGNOSIS — M25661 Stiffness of right knee, not elsewhere classified: Secondary | ICD-10-CM | POA: Diagnosis not present

## 2018-05-20 NOTE — Progress Notes (Signed)
Referral faxed to Dr. Tamala Fothergill office at [facility] clinic.

## 2018-05-21 ENCOUNTER — Telehealth: Payer: Self-pay | Admitting: Oncology

## 2018-05-21 NOTE — Telephone Encounter (Signed)
Notified Mandy Bauer of her appointment with Dr. Waymond Cera on 08/10/18 at 11:00.  She was advised that she will receive a letter with directions and instructions for her appointment.  She was also given the office number of [phone].

## 2018-05-25 DIAGNOSIS — M25661 Stiffness of right knee, not elsewhere classified: Secondary | ICD-10-CM | POA: Diagnosis not present

## 2018-05-27 DIAGNOSIS — M25661 Stiffness of right knee, not elsewhere classified: Secondary | ICD-10-CM | POA: Diagnosis not present

## 2018-06-01 DIAGNOSIS — M25661 Stiffness of right knee, not elsewhere classified: Secondary | ICD-10-CM | POA: Diagnosis not present

## 2018-06-03 DIAGNOSIS — M25661 Stiffness of right knee, not elsewhere classified: Secondary | ICD-10-CM | POA: Diagnosis not present

## 2018-06-07 DIAGNOSIS — M25661 Stiffness of right knee, not elsewhere classified: Secondary | ICD-10-CM | POA: Diagnosis not present

## 2018-06-09 DIAGNOSIS — M25661 Stiffness of right knee, not elsewhere classified: Secondary | ICD-10-CM | POA: Diagnosis not present

## 2018-06-15 DIAGNOSIS — M25661 Stiffness of right knee, not elsewhere classified: Secondary | ICD-10-CM | POA: Diagnosis not present

## 2018-06-17 DIAGNOSIS — M25661 Stiffness of right knee, not elsewhere classified: Secondary | ICD-10-CM | POA: Diagnosis not present

## 2018-06-23 DIAGNOSIS — M25661 Stiffness of right knee, not elsewhere classified: Secondary | ICD-10-CM | POA: Diagnosis not present

## 2018-06-30 DIAGNOSIS — M25661 Stiffness of right knee, not elsewhere classified: Secondary | ICD-10-CM | POA: Diagnosis not present

## 2018-07-05 DIAGNOSIS — M25661 Stiffness of right knee, not elsewhere classified: Secondary | ICD-10-CM | POA: Diagnosis not present

## 2018-07-08 DIAGNOSIS — M25661 Stiffness of right knee, not elsewhere classified: Secondary | ICD-10-CM | POA: Diagnosis not present

## 2018-07-19 DIAGNOSIS — M25661 Stiffness of right knee, not elsewhere classified: Secondary | ICD-10-CM | POA: Diagnosis not present

## 2018-07-19 DIAGNOSIS — R070 Pain in throat: Secondary | ICD-10-CM | POA: Diagnosis not present

## 2018-07-19 DIAGNOSIS — J309 Allergic rhinitis, unspecified: Secondary | ICD-10-CM | POA: Diagnosis not present

## 2018-07-22 DIAGNOSIS — M25661 Stiffness of right knee, not elsewhere classified: Secondary | ICD-10-CM | POA: Diagnosis not present

## 2018-08-10 DIAGNOSIS — C562 Malignant neoplasm of left ovary: Secondary | ICD-10-CM | POA: Diagnosis not present

## 2018-08-13 DIAGNOSIS — Z90721 Acquired absence of ovaries, unilateral: Secondary | ICD-10-CM | POA: Diagnosis not present

## 2018-08-13 DIAGNOSIS — C562 Malignant neoplasm of left ovary: Secondary | ICD-10-CM | POA: Diagnosis not present

## 2018-08-20 DIAGNOSIS — S83231D Complex tear of medial meniscus, current injury, right knee, subsequent encounter: Secondary | ICD-10-CM | POA: Diagnosis not present

## 2018-08-20 DIAGNOSIS — S83511D Sprain of anterior cruciate ligament of right knee, subsequent encounter: Secondary | ICD-10-CM | POA: Diagnosis not present

## 2018-09-03 ENCOUNTER — Ambulatory Visit: Payer: 59 | Admitting: Gynecologic Oncology

## 2018-10-19 DIAGNOSIS — J111 Influenza due to unidentified influenza virus with other respiratory manifestations: Secondary | ICD-10-CM | POA: Diagnosis not present

## 2018-11-25 ENCOUNTER — Inpatient Hospital Stay: Payer: 59 | Attending: Hematology and Oncology

## 2018-11-25 DIAGNOSIS — C562 Malignant neoplasm of left ovary: Secondary | ICD-10-CM | POA: Insufficient documentation

## 2018-11-25 DIAGNOSIS — Z90721 Acquired absence of ovaries, unilateral: Secondary | ICD-10-CM | POA: Diagnosis not present

## 2018-11-25 DIAGNOSIS — Z9221 Personal history of antineoplastic chemotherapy: Secondary | ICD-10-CM | POA: Diagnosis not present

## 2018-11-25 LAB — CBC WITH DIFFERENTIAL (CANCER CENTER ONLY)
ABS IMMATURE GRANULOCYTES: 0.01 10*3/uL (ref 0.00–0.07)
BASOS ABS: 0 10*3/uL (ref 0.0–0.1)
Basophils Relative: 1 %
EOS PCT: 2 %
Eosinophils Absolute: 0.1 10*3/uL (ref 0.0–0.5)
HEMATOCRIT: 42.7 % (ref 36.0–46.0)
HEMOGLOBIN: 14.6 g/dL (ref 12.0–15.0)
Immature Granulocytes: 0 %
LYMPHS ABS: 1.7 10*3/uL (ref 0.7–4.0)
LYMPHS PCT: 24 %
MCH: 30.3 pg (ref 26.0–34.0)
MCHC: 34.2 g/dL (ref 30.0–36.0)
MCV: 88.6 fL (ref 80.0–100.0)
Monocytes Absolute: 0.6 10*3/uL (ref 0.1–1.0)
Monocytes Relative: 8 %
NEUTROS ABS: 4.6 10*3/uL (ref 1.7–7.7)
NRBC: 0 % (ref 0.0–0.2)
Neutrophils Relative %: 65 %
Platelet Count: 155 10*3/uL (ref 150–400)
RBC: 4.82 MIL/uL (ref 3.87–5.11)
RDW: 11.9 % (ref 11.5–15.5)
WBC Count: 7.1 10*3/uL (ref 4.0–10.5)

## 2018-11-25 LAB — LACTATE DEHYDROGENASE: LDH: 156 U/L (ref 98–192)

## 2018-11-26 LAB — BETA HCG QUANT (REF LAB)

## 2018-12-03 ENCOUNTER — Encounter: Payer: Self-pay | Admitting: Gynecologic Oncology

## 2018-12-03 ENCOUNTER — Inpatient Hospital Stay (HOSPITAL_BASED_OUTPATIENT_CLINIC_OR_DEPARTMENT_OTHER): Payer: 59 | Admitting: Gynecologic Oncology

## 2018-12-03 VITALS — BP 133/67 | HR 60 | Temp 98.2°F | Resp 16 | Ht 66.0 in | Wt 167.0 lb

## 2018-12-03 DIAGNOSIS — Z9221 Personal history of antineoplastic chemotherapy: Secondary | ICD-10-CM

## 2018-12-03 DIAGNOSIS — Z90721 Acquired absence of ovaries, unilateral: Secondary | ICD-10-CM

## 2018-12-03 DIAGNOSIS — C562 Malignant neoplasm of left ovary: Secondary | ICD-10-CM

## 2018-12-03 NOTE — Progress Notes (Signed)
Follow-up Note: Gyn-Onc  Consult was initially requested by Avon Gully, NP for the evaluation of Mandy Bauer 17 y.o. [place]  CC:  Chief Complaint  Patient presents with  . Dysgerminoma of left ovary [facility])    Assessment/Plan:  Mandy Bauer  is a 17 y.o.  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 Completed therapy December, 2018.  No evidence of disease on exam or tumor markers.  Will continue surveillance with 3 monthly tumor marker assessments (HCG and LDH). Prescribed OCP use while in surveillance period to avoid pregnancy as cause of elevated HCG.  Will have her see Dr Bridgett Larsson in [place] to follow her during the school year. I will see her back during summer break in June/July, 2020.  HPI: Mandy Bauer is a 17 year old P0 who was seen in consultation at the request of Avon Gully, NP for a large pelvic mass.  The patient is otherwise very healthy. 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  In May, 2018 with her menstrual period she experienced severe flushing, headaches, palpitations, nausea. This recurred again in June and she was seen at an urgent care. She was recommended to see her OBGYN about starting OCP's for possible menstrual migraines. Her menstrual period was otherwise normal.  She saw Avon Gully, NP on 06/08/17 and a mass was felt on pelvic examination. This prompted a TVUS to be performed. A large heterogeneous mass was noted extending from the posterior aspect of the uterus to above the umbilicus. Unable to decifer place of origin. Separate ovaries not seen. Increased vascularity noted within. Area measuring 21.5x15x12.7cm.    She then received an MRI of the  abdomen and pelvis on 06/12/17 which revealed a small splenule along medial spleen. She also had a 77m and 11 mm left periaortic lymph nodes identified that were mildly enlarged.  Within the pelvis and extending to the lower abdomen was a 17.8x15.5x7.5cm lobular intermediate T2 signal mass. The mass demonstrated heterogeneous enhancement centrally. No definite fat plane was seen between the mass and posterior uterus. The right ovary is identified and appeared separate to the mass measuring 5cm. The left ovary could not definitively be seen. There is a moderate amount of free fluid in the pelvis and additionally, there were a few enhancing nodules measuring up to 1.9cm in the cul de sac.  CEA was 1.5 CA 125 was 137  Alk Phos was elevated at 196 AFP was 1.2 (normal) HCG was elevated at 344 on 06/16/17 LDH was elevated at 634 on 06/16/17  On 06/18/17 she underwent an exploratory laparotomy, partial omentectomy, biopsy of left ovarian mass. Intraoperative findings included a 20+ cm solid left ovarian tumor densely adherent to the posterior uterus (infiltrating into the uterus/myometrium) with omentum attached anteriorally, mobile but enlarged left PA nodes, no other gross intraperitoneal disease. Due to the inability to resect the mass and preserve uterine fertility a decision was made to biopsy the mass. A segment of omentum that was adherent to the tumor was also removed.  Final pathology confirmed a mixed germ cell tumor with 98% dysgerminoma and 2% yolk sac tumor. The omentum was positive for nodules (microscopic) germ cell tumor. The washings/ascites were positive.  PET/CT on 06/26/17 showed very large pelvic mass compatible with germ cell neoplasm is again identified and exhibits intense heterogeneity FDG uptake at least 1 area of peritoneal nodularity is identified. There is also ascites within the lower abdomen and pelvis. Suspicious for peritoneal metastases. Borderline enlarged periaortic lymph  nodes. Retroperitoneal nodal metastasis cannot be excluded. No evidence for osseous metastasis or metastatic disease to the neck or chest. MRI brain was negative for metastases.  Her case was discussed at multidisciplinary conference and she was recommended to receive 3-4 cycles of BEP chemotherapy prior to reoperation and resection of the left ovary, omentum, nodes.  Cycle 1 of BEP was administered on 07/14/17. Cycle 2 was adminstered on 8/28-08/08/17. Cycle 3 was administered on 08/25/18.  She tolerated therapy well though had some acquired pancytopenia and neutropenia.  MRI on 09/16/17 showed: Resolution of hydronephrosis. Small left periaortic nodes remain, no pelvic sidewall adenopathy. Significant decrease in size of left pelvic mass, measuring 7.9x7.8x5.8cm. It is again intimately associated with the posterior uterine body and fundus. No dominant right ovarian mass.  No omental nodules. New complexity within the cul de sac fluid.   On 10/08/17 she underwent an interval surgery with ex lap, omentectomy, left salpingo-ophorectomy. The ovarian mass measured 11.5cm and was densely adherent to the posterior uterus. However, it was able to be freed from the uterine body without entry into the endometrial cavity. The right ovary was grossly normal and covered with filmy adhesions. There was no gross residual disease remaining at the completion of surgery.  Final pathology revealed only microscopic foci of viable yolk sac tumor <0.1cm in the left ovary. The omentum was negative.  She received 1 additional cycle of BEP (cycle 4) on 11/09/17.   Since surgery she has remained disease free.   Tumor markers were normal (HCG undetectable on 08/24/17 - prior to debulking surgery) and continued <1 s/p chemotherapy on 11/20/17.  02/12/18 HCG was <1  LDH was 165 (normal)  05/13/18: HCG was <1  LDH was 169 (normal)   11/25/18 HCG was <1 LDH was 156 (normal)  Interval Hx:  The patient has returned to  horseback riding. She has deferred college until the fall of 2019. She will go to school near [place].   She had a right knee meniscus injury this spring and had ACL and meniscus surgery in May, 2019.  She notes occasional stabs of pain in the left lower quadrant that are occasional and not severe.   Current Meds:  Outpatient Encounter Medications as of 12/03/2018  Medication Sig  . acetaminophen (TYLENOL) 500 MG tablet Take 1 tablet (500 mg total) by mouth every 6 (six) hours as needed.  Marland Kitchen ibuprofen (ADVIL,MOTRIN) 400 MG tablet Take 2 tablets (800 mg total) by mouth every 6 (six) hours as needed for fever.  . norgestimate-ethinyl estradiol (ORTHO-CYCLEN, 28,) 0.25-35 MG-MCG tablet Take 1 tablet by mouth daily.   No facility-administered encounter medications on file as of 12/03/2018.     Allergy: No Known Allergies  Social Hx:   Social History   Socioeconomic History  . Marital status: Single    Spouse name: Not on file  . Number of children: 0  . Years of education: Not on file  . Highest education level: Not on file  Occupational History  . Occupation: Ship broker  Social Needs  . Financial resource strain: Not on file  . Food insecurity:    Worry: Not on file    Inability: Not on file  .  Transportation needs:    Medical: Not on file    Non-medical: Not on file  Tobacco Use  . Smoking status: Never Smoker  . Smokeless tobacco: Never Used  Substance and Sexual Activity  . Alcohol use: No  . Drug use: No  . Sexual activity: Yes  Lifestyle  . Physical activity:    Days per week: Not on file    Minutes per session: Not on file  . Stress: Not on file  Relationships  . Social connections:    Talks on phone: Not on file    Gets together: Not on file    Attends religious service: Not on file    Active member of club or organization: Not on file    Attends meetings of clubs or organizations: Not on file    Relationship status: Not on file  . Intimate partner  violence:    Fear of current or ex partner: Not on file    Emotionally abused: Not on file    Physically abused: Not on file    Forced sexual activity: Not on file  Other Topics Concern  . Not on file  Social History Narrative  . Not on file    Past Surgical Hx:  Past Surgical History:  Procedure Laterality Date  . DEBULKING N/A 10/08/2017   Procedure: RADICAL TUMOR DEBULKING;  Surgeon: Everitt Amber, MD;  Location: [facility];  Service: Gynecology;  Laterality: N/A;  . IR FLUORO GUIDE PORT INSERTION RIGHT  06/29/2017  . IR REMOVAL TUN ACCESS W/ PORT W/O FL MOD SED  12/21/2017  . IR US GUIDE VASC ACCESS RIGHT  06/29/2017  . LAPAROTOMY N/A 06/18/2017   Procedure: EXPLORATORY LAPAROTOMY, BIOPSY OF PELVIC MASS;  Surgeon: Everitt Amber, MD;  Location: [facility];  Service: Gynecology;  Laterality: N/A;  . LAPAROTOMY N/A 10/08/2017   Procedure: EXPLORATORY LAPAROTOMY; OMENTECTOMY;  Surgeon: Everitt Amber, MD;  Location: [facility];  Service: Gynecology;  Laterality: N/A;  . SALPINGOOPHORECTOMY Left 10/08/2017   Procedure: LEFT SALPINGO OOPHORECTOMY;  Surgeon: Everitt Amber, MD;  Location: [facility];  Service: Gynecology;  Laterality: Left;  . TONSILLECTOMY  2005    Past Medical Hx:  Past Medical History:  Diagnosis Date  . Migraine   . Pelvic mass in [place]     Past Gynecological History:  Sexually active. G0. Never had pap. No LMP recorded. Patient has had an injection.  Family Hx:  Family History  Problem Relation Age of Onset  . Hypertension Maternal Grandmother   . Heart disease Maternal Grandmother   . Diabetes Maternal Grandfather   . Lymphoma Maternal Grandfather 76       non-Hodgkin lymphoma    Review of Systems:  Constitutional  Feels well,   Occasional flushing. ENT Normal appearing ears and nares bilaterally Skin/Breast  No rash, sores, jaundice, itching, dryness Cardiovascular  No chest pain, shortness of breath, or edema  Pulmonary  No cough or wheeze.  Gastro Intestinal  No  nausea, vomitting, or diarrhoea. No bright red blood per rectum, no abdominal pain, change in bowel movement, or constipation.  Genito Urinary  No frequency, urgency, dysuria, No abnormal bleeding Musculo Skeletal  No myalgia, arthralgia, joint swelling or pain  Neurologic  No weakness, numbness, change in gait,  Psychology  No depression, anxiety, insomnia.   Vitals:  Blood pressure 133/67, pulse 60, temperature 98.2 F (36.8 C), temperature source Oral, resp. rate 16, height '5\' 6"'$  (1.676 m), weight 167 lb (75.8 kg), SpO2 100 %.  Physical  Exam: WD in NAD Neck  Supple NROM, without any enlargements.  Lymph Node Survey No cervical supraclavicular or inguinal adenopathy Cardiovascular  Pulse normal rate, regularity and rhythm. S1 and S2 normal.  Lungs  Clear to auscultation bilateraly, without wheezes/crackles/rhonchi. Good air movement.  Skin  No rash/lesions/breakdown  Psychiatry  Alert and oriented to person, place, and time  Abdomen  Normoactive bowel sounds, abdomen soft, non-tender and thin without evidence of hernia. Mass not palpable.  Back No CVA tenderness Genito Urinary: normal external Bauer genitalia. Normal vaginal and cervix. Small mobile uterus and no palpable ovarian masses or cul de sac nodularity.  Rectal  deferred  Extremities  No bilateral cyanosis, clubbing or edema.   Thereasa Solo, MD  12/03/2018, 2:24 PM

## 2018-12-03 NOTE — Patient Instructions (Signed)
Please notify Dr Denman George at phone number [phone] if you notice vaginal bleeding, new pelvic or abdominal pains, bloating, feeling full easy, or a change in bladder or bowel function.   Please return to see Dr Waymond Cera in [place] in 3 months and Dr Denman George in [place] in June, 2020.

## 2019-01-18 ENCOUNTER — Telehealth: Payer: Self-pay

## 2019-01-18 ENCOUNTER — Other Ambulatory Visit: Payer: Self-pay | Admitting: Gynecologic Oncology

## 2019-01-18 DIAGNOSIS — C562 Malignant neoplasm of left ovary: Secondary | ICD-10-CM

## 2019-01-18 MED ORDER — NORGESTIMATE-ETH ESTRADIOL 0.25-35 MG-MCG PO TABS
1.0000 | ORAL_TABLET | Freq: Every day | ORAL | 11 refills | Status: DC
Start: 2019-01-18 — End: 2019-09-02

## 2019-01-18 NOTE — Progress Notes (Signed)
Refill requested

## 2019-01-18 NOTE — Telephone Encounter (Signed)
Mother called back to make sure we have name information for her daughter -she wasn't sure if she left it on VM- she verbalized that her daughter is away at college,  would like prescription for birth control refilled at least until June as she will be seeing Dr Denman George then.  Notified Joylene John NP.  No other needs per pt's mother at this time.

## 2019-02-04 ENCOUNTER — Telehealth: Payer: Self-pay

## 2019-02-04 NOTE — Telephone Encounter (Signed)
Incoming call from Mandy Bauer office - Dr Waymond Cera- have this patient as mutual patient and per Dr Denman George, pt is seeing Dr Waymond Cera while she is attending school there.  Faxed Dec 2019 office visit and labs to their office per request as pt is seeing physician there while in college.

## 2019-02-18 ENCOUNTER — Other Ambulatory Visit: Payer: Self-pay | Admitting: Gynecologic Oncology

## 2019-02-18 ENCOUNTER — Telehealth: Payer: Self-pay | Admitting: *Deleted

## 2019-02-18 DIAGNOSIS — C562 Malignant neoplasm of left ovary: Secondary | ICD-10-CM

## 2019-02-18 NOTE — Telephone Encounter (Signed)
Returned the patient's call and scheduled her for a lab appt on Monday. Patient to have labs in [place] at college, college shut down. Ok per [facility] APP to schedule appt

## 2019-02-21 ENCOUNTER — Other Ambulatory Visit: Payer: Self-pay

## 2019-02-21 ENCOUNTER — Inpatient Hospital Stay: Payer: 59 | Attending: Hematology and Oncology

## 2019-02-21 DIAGNOSIS — Z9221 Personal history of antineoplastic chemotherapy: Secondary | ICD-10-CM | POA: Insufficient documentation

## 2019-02-21 DIAGNOSIS — Z90721 Acquired absence of ovaries, unilateral: Secondary | ICD-10-CM | POA: Diagnosis not present

## 2019-02-21 DIAGNOSIS — C562 Malignant neoplasm of left ovary: Secondary | ICD-10-CM | POA: Insufficient documentation

## 2019-02-21 LAB — LACTATE DEHYDROGENASE: LDH: 166 U/L (ref 98–192)

## 2019-02-22 LAB — BETA HCG QUANT (REF LAB)

## 2019-02-23 IMAGING — MR MR PELVIS WO/W CM
14 of 22 series · 22 of 48 positions shown · IV contrast (multihance)
Comparison: None.

CLINICAL DATA: Patient with large heterogeneous pelvic mass
identified on recent ultrasound.

EXAM:
MRI ABDOMEN AND PELVIS WITHOUT AND WITH CONTRAST
TECHNIQUE: Multiplanar multisequence MR imaging of the abdomen and pelvis was
performed both before and after the administration of intravenous
contrast.
CONTRAST:  14mL MULTIHANCE GADOBENATE DIMEGLUMINE 529 MG/ML IV SOLN

[Series 2: T2 · coronal · 8.0mm · 0.74mm/px · 1 of 25 slices shown (1 of 3)]
[im 1/25]
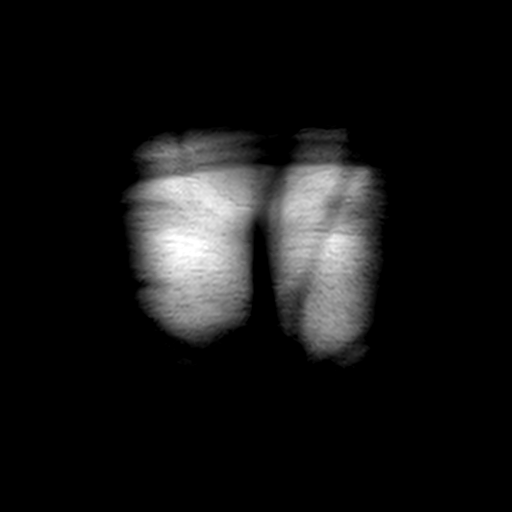

[Series 3: T2 fat-sat · axial · 8.0mm · 0.74mm/px · 1 of 35 slices shown (1 of 2)]
[im 1/35]
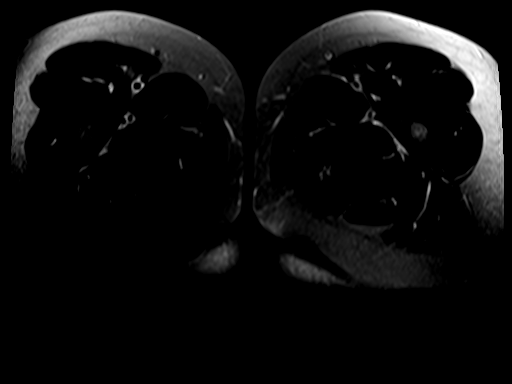

[Series 4: T2 · axial · 8.0mm · 0.74mm/px · 1 of 35 slices shown (2 of 3)]
[im 1/35]
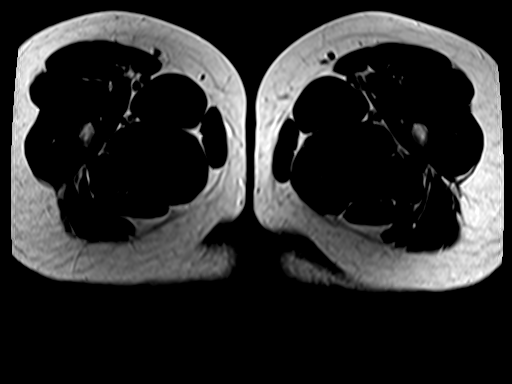

[Series 5: T2 · sagittal · 8.0mm · 0.74mm/px · 1 of 33 slices shown (3 of 3)]
[im 1/33]
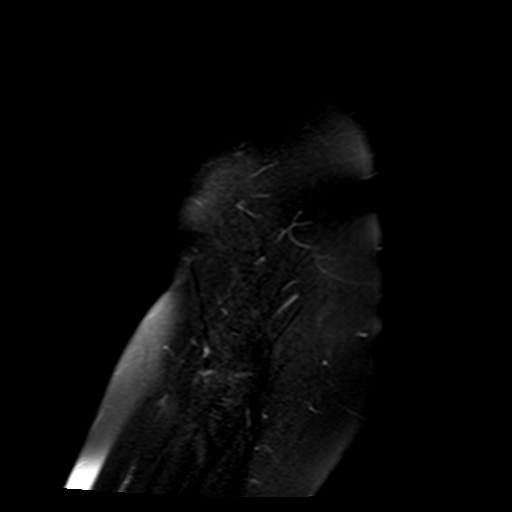

[Series 6: T1 · axial · 2.5mm · 0.74mm/px · z∈[-107,+211]mm · 3 of 128 slices shown (1 of 2)]
[im 1/128]
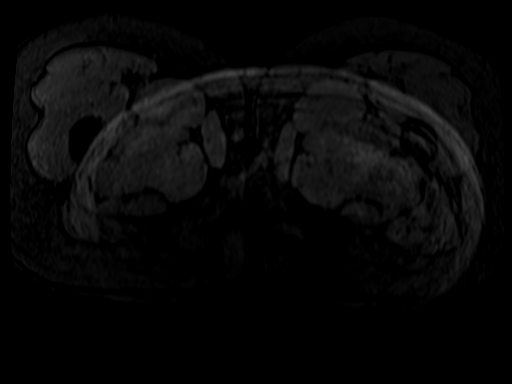
[im 64/128]
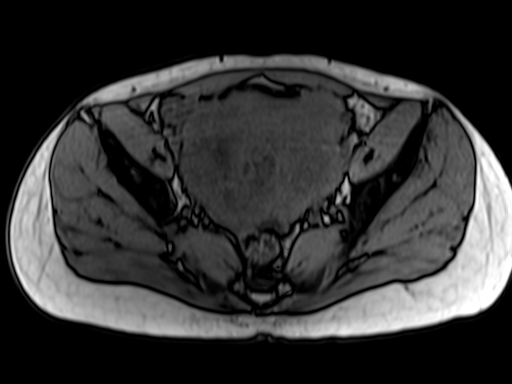
[im 128/128]
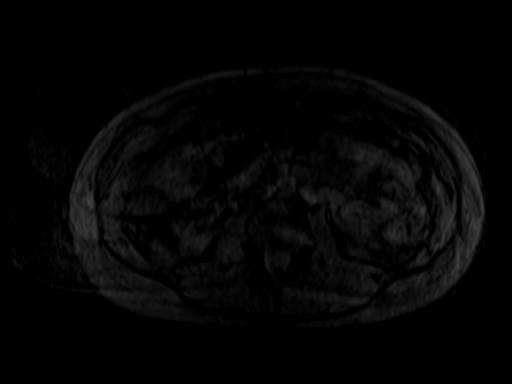

[Series 7: T1 fat-sat · axial · non-contrast · 2.5mm · 0.74mm/px · z∈[-107,+211]mm · 3 of 128 slices shown]
[im 1/128]
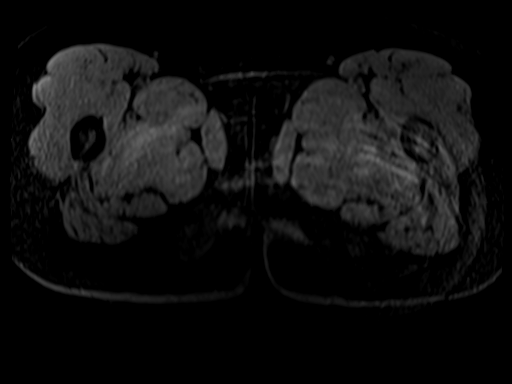
[im 64/128]
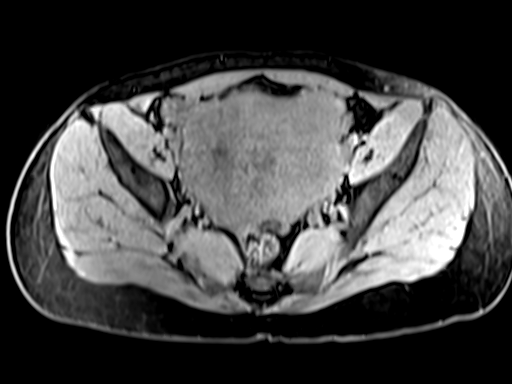
[im 128/128]
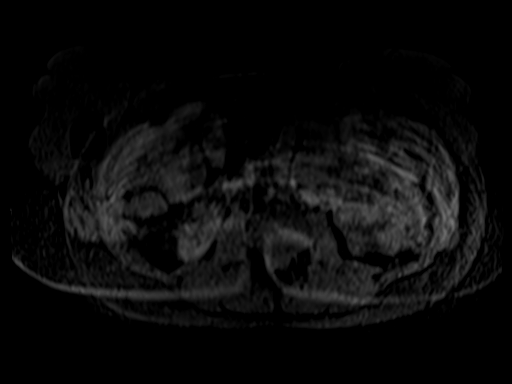

[Series 11: cor ssfse/haste abd · coronal · 8.0mm · 0.70mm/px · 1 of 25 slices shown]
[im 1/25]
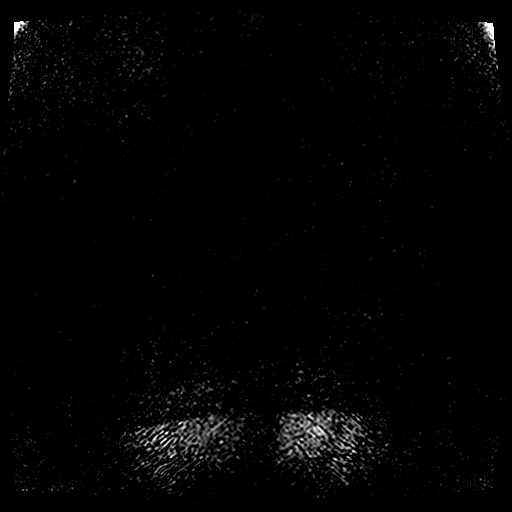

[Series 12: bSSFP · axial · 4.0mm · 0.66mm/px · 1 of 65 slices shown]
[im 1/65]
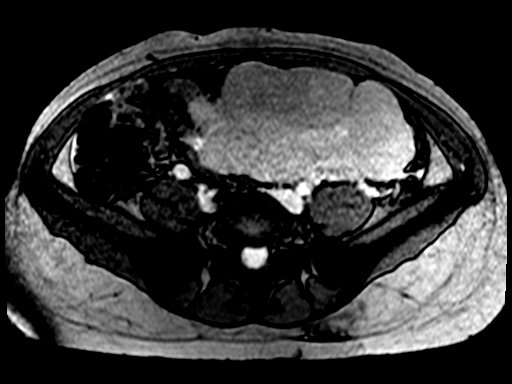

[Series 13: T2 fat-sat · axial · 8.0mm · 0.66mm/px · 1 of 28 slices shown (2 of 2)]
[im 1/28]
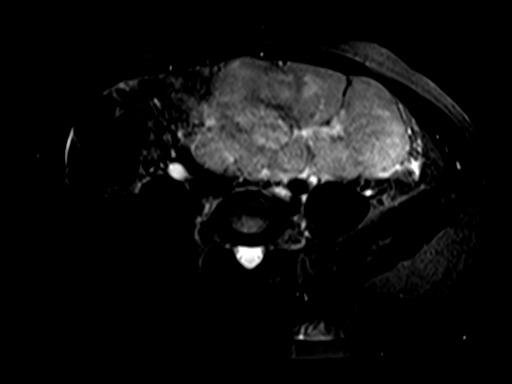

[Series 14: axial ssfse/haste abd · axial · 8.0mm · 0.78mm/px · 1 of 28 slices shown]
[im 1/28]
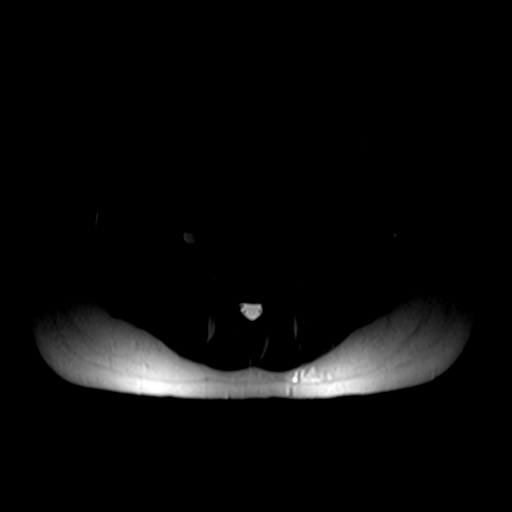

[Series 15: T1 · axial · 8.0mm · 0.66mm/px · 1 of 56 slices shown (2 of 2)]
[im 1/56]
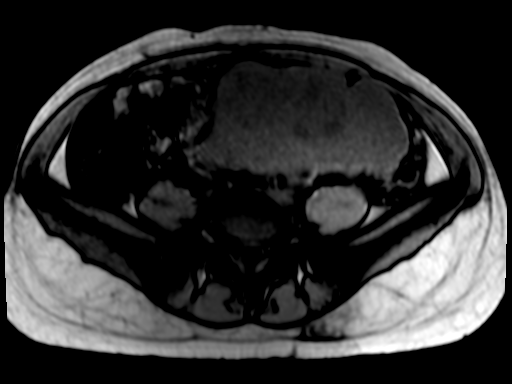

[Series 16: T1 dynamic fat-sat · axial · non-contrast · 2.5mm · 0.70mm/px · z∈[+133,+371]mm · 2 of 96 slices shown (1 of 3)]
[im 1/96]
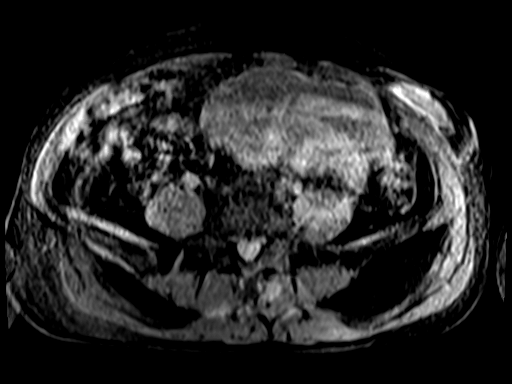
[im 96/96]
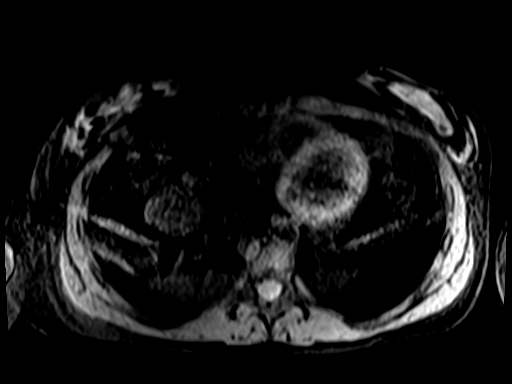

[Series 17: T1 dynamic fat-sat · axial · 2.5mm · 0.70mm/px · z∈[+133,+371]mm · 3 of 96 slices shown (2 of 3)]
[im 1/96]
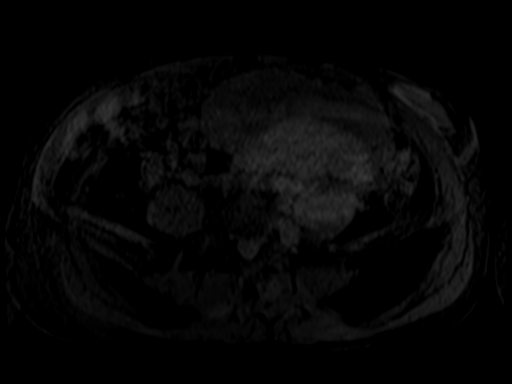
[im 48/96]
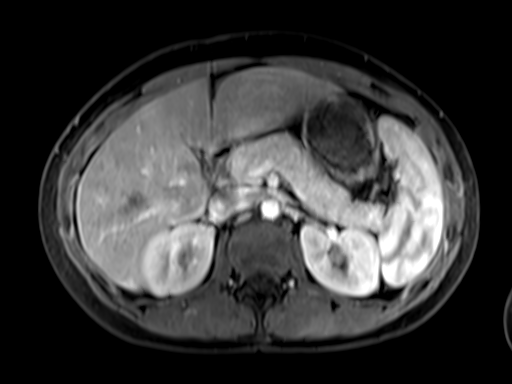
[im 96/96]
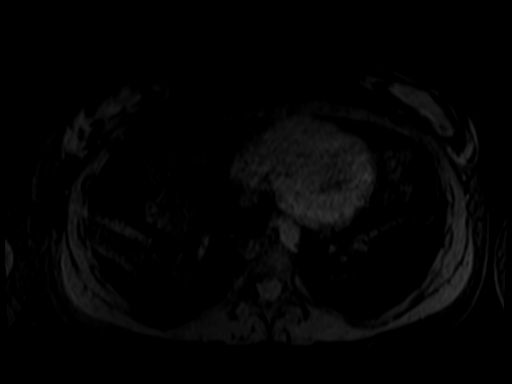

[Series 18: T1 dynamic fat-sat · axial · 2.5mm · 0.70mm/px · z∈[+133,+251]mm · 2 of 96 slices shown (3 of 3)]
[im 1/96]
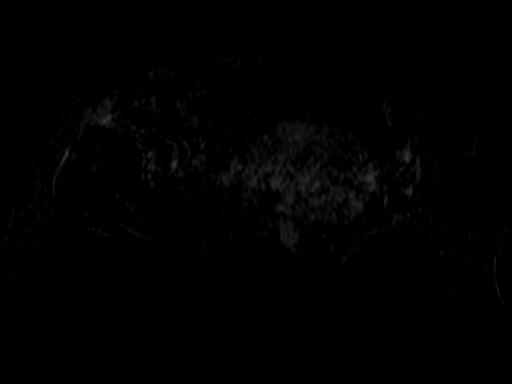
[im 48/96]
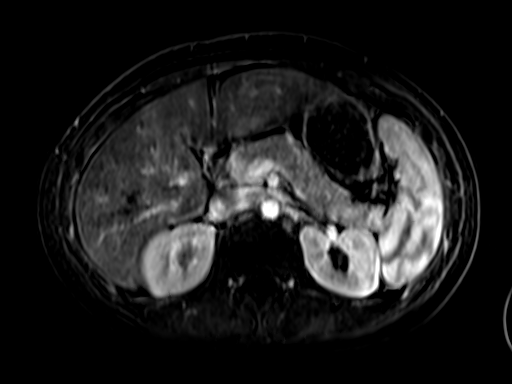

[22 of 48 positions shown; findings below may reference images not displayed]

FINDINGS: COMBINED FINDINGS FOR BOTH MR ABDOMEN AND PELVIS

Lower chest: Normal heart size.  No pericardial effusion.

Hepatobiliary: Liver is normal in size and contour. No focal hepatic
lesion is identified. Gallbladder is decompressed.

Pancreas:  Unremarkable

Spleen: Unremarkable. Suspect small splenule along the medial
spleen.

Adrenals/Urinary Tract: The adrenal glands are normal. Kidneys
enhance symmetrically with contrast. Mild bilateral
hydroureteronephrosis to the level of the pelvis.

Stomach/Bowel: Normal morphology of the stomach. No abnormal bowel
wall thickening or evidence for bowel obstruction.

Vascular/Lymphatic: Enlarged left periaortic lymph node measuring 12
mm (image 69; series 21). Additional enlarged left periaortic lymph
node measuring up to 11 mm (image 60; series 21).

Reproductive: Within the pelvis extending into the lower abdomen is
a 17.8 x 15.5 x 7.5 cm lobular intermediate T2 signal mass. The mass
demonstrates heterogeneous enhancement with multiple T2 bright
nonenhancing regions centrally within the mass. No definable fat
plane between the mass and the posterior aspect of the uterus. The
right ovary is identified and appears to be separate within the
right hemipelvis measuring 5.4 x 3.2 cm (image 19; series 2). The
left ovary is not definitively defined. There is a moderate amount
of free fluid in the pelvis. Additionally within the pelvic
cul-de-sac there are a few enhancing nodules measuring up to 1.9 cm
(image 83; series 26).

Other:  None.

Musculoskeletal: No aggressive or acute appearing osseous lesions.
IMPRESSION: Large heterogeneous enhancing mass originating from the pelvis
concerning for malignancy, potentially representing a germ-cell
tumor. This may be arising from the left ovary as the right ovary
appears to be separate from the mass. This mass may be invading the
posterior aspect of the uterus as no definable fat plane is
identified between the posterior uterine margin Tiger mass.

Enhancing nodules within the pelvis concerning for the possibility
of peritoneal metastatic disease.

There are a few enlarged left periaortic lymph nodes which may
represent metastatic disease.

There is mild to moderate bilateral hydroureteronephrosis likely
secondary to mass effect from the large pelvic mass.

These results will be called to the ordering clinician or
representative by the Radiologist Assistant, and communication
documented in the PACS or zVision Dashboard.

## 2019-03-12 IMAGING — US IR US GUIDE VASC ACCESS RIGHT
1 series · 2 of 2 positions shown · non-contrast
Comparison: none

CLINICAL DATA: Ovarian carcinoma, access for chemotherapy

[Series 1: ir fluoro/shunt/fist · 2 of 2 slices shown]
[im 1/2]
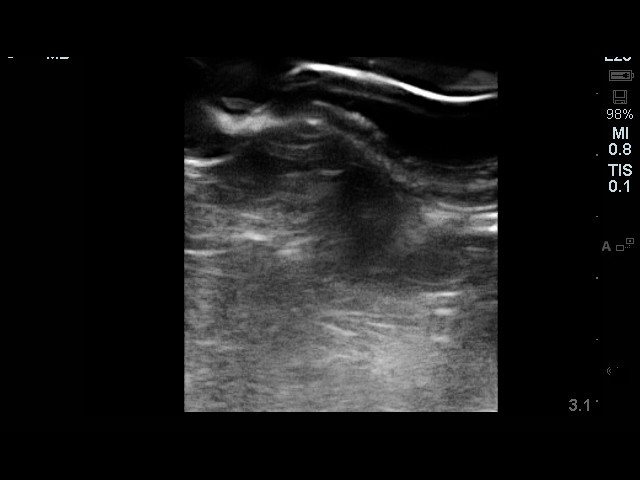
[im 2/2]
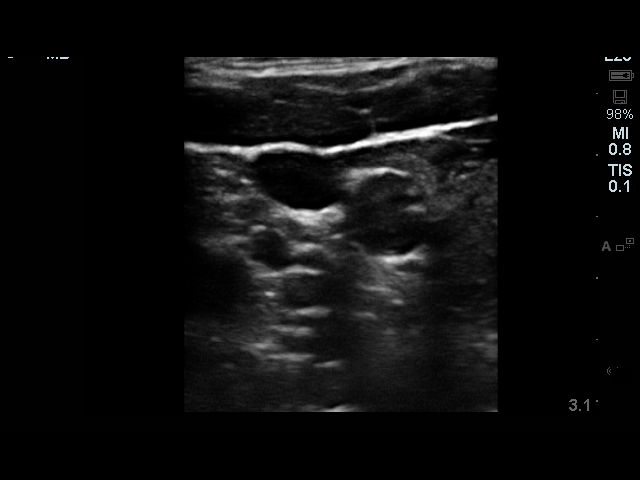

[2 of 2 positions shown; findings below may reference images not displayed]

EXAM:
RIGHT INTERNAL JUGULAR SINGLE LUMEN POWER PORT CATHETER INSERTION

Radiologist:  Moolman, Klpigbb

Guidance:  Ultrasound and fluoroscopic

MEDICATIONS:
Ancef 2 g; The antibiotic was administered within an appropriate
time interval prior to skin puncture.

ANESTHESIA/SEDATION:
Versed 4.0 mg IV; Fentanyl 150 mcg IV;

Moderate Sedation Time:  43 minutes

The patient was continuously monitored during the procedure by the
interventional radiology nurse under my direct supervision.

FLUOROSCOPY TIME:  1 minutes, 36 seconds (3 mGy)

COMPLICATIONS:
None immediate.

CONTRAST:  None.

PROCEDURE:
Informed consent was obtained from the patient following explanation
of the procedure, risks, benefits and alternatives. The patient
understands, agrees and consents for the procedure. All questions
were addressed. A time out was performed.

Maximal barrier sterile technique utilized including caps, mask,
sterile gowns, sterile gloves, large sterile drape, hand hygiene,
and 2% chlorhexidine scrub.

Under sterile conditions and local anesthesia, right internal
jugular micropuncture venous access was performed. Access was
performed with ultrasound. Images were obtained for documentation. A
guide wire was inserted followed by a transitional dilator. This
allowed insertion of a guide wire and catheter into the IVC.
Measurements were obtained from the SVC / RA junction back to the
right IJ venotomy site. In the right infraclavicular chest, a
subcutaneous pocket was created over the second anterior rib. This
was done under sterile conditions and local anesthesia. 1% lidocaine
with epinephrine was utilized for this. A 2.5 cm incision was made
in the skin. Blunt dissection was performed to create a subcutaneous
pocket over the right pectoralis major muscle. The pocket was
flushed with saline vigorously. There was adequate hemostasis. The
port catheter was assembled and checked for leakage. The port
catheter was secured in the pocket with two retention sutures. The
tubing was tunneled subcutaneously to the right venotomy site and
inserted into the SVC/RA junction through a valved peel-away sheath.
Position was confirmed with fluoroscopy. Images were obtained for
documentation. The patient tolerated the procedure well. No
immediate complications. Incisions were closed in a two layer
fashion with 4 - 0 Vicryl suture. Dermabond was applied to the skin.
The port catheter was accessed, blood was aspirated followed by
saline and heparin flushes. Needle was removed. A dry sterile
dressing was applied.
IMPRESSION: Ultrasound and fluoroscopically guided right internal jugular single
lumen power port catheter insertion. Tip in the SVC/RA junction.
Catheter ready for use.

## 2019-05-27 ENCOUNTER — Other Ambulatory Visit: Payer: Self-pay

## 2019-05-27 ENCOUNTER — Inpatient Hospital Stay: Payer: 59 | Attending: Gynecologic Oncology

## 2019-05-27 DIAGNOSIS — C562 Malignant neoplasm of left ovary: Secondary | ICD-10-CM | POA: Diagnosis present

## 2019-05-27 LAB — LACTATE DEHYDROGENASE: LDH: 157 U/L (ref 98–192)

## 2019-05-28 LAB — BETA HCG QUANT (REF LAB): hCG Quant: <1 m[IU]/mL

## 2019-06-01 ENCOUNTER — Telehealth: Payer: Self-pay

## 2019-06-01 NOTE — Telephone Encounter (Signed)
Mother calling l to see if Dr. Denman George wants to see Mandy Bauer in person or do a Virtual visit Friday 06-03-19. Mandy Bauer got called out of town to [facility] and is in remote area so that is why mother is calling. Mandy Bauer will return to town Saturday 06-04-2019. Mandy Bauer concerned that she has not heard about her labs from 05-27-19. Told Mandy Bauer  that Dr. Denman George would review at visit 6-26. Told mother Mandy Bauer that the LDH and Beta HCG Quant(tumor marker) are both WNL.  R/S Mandy Bauer to 07-06-19 at 3:45 pm.

## 2019-06-03 ENCOUNTER — Inpatient Hospital Stay: Payer: 59 | Admitting: Gynecologic Oncology

## 2019-07-06 ENCOUNTER — Encounter: Payer: Self-pay | Admitting: Gynecologic Oncology

## 2019-07-06 ENCOUNTER — Inpatient Hospital Stay: Payer: 59 | Attending: Gynecologic Oncology | Admitting: Gynecologic Oncology

## 2019-07-06 ENCOUNTER — Other Ambulatory Visit: Payer: Self-pay

## 2019-07-06 VITALS — BP 124/68 | HR 76 | Temp 98.5°F | Resp 19 | Ht 66.0 in | Wt 163.8 lb

## 2019-07-06 DIAGNOSIS — R51 Headache: Secondary | ICD-10-CM | POA: Diagnosis not present

## 2019-07-06 DIAGNOSIS — C562 Malignant neoplasm of left ovary: Secondary | ICD-10-CM | POA: Diagnosis present

## 2019-07-06 DIAGNOSIS — Z90722 Acquired absence of ovaries, bilateral: Secondary | ICD-10-CM | POA: Diagnosis not present

## 2019-07-06 DIAGNOSIS — R110 Nausea: Secondary | ICD-10-CM | POA: Diagnosis not present

## 2019-07-06 DIAGNOSIS — Z90710 Acquired absence of both cervix and uterus: Secondary | ICD-10-CM | POA: Insufficient documentation

## 2019-07-06 DIAGNOSIS — R002 Palpitations: Secondary | ICD-10-CM | POA: Insufficient documentation

## 2019-07-06 DIAGNOSIS — L905 Scar conditions and fibrosis of skin: Secondary | ICD-10-CM

## 2019-07-06 NOTE — Patient Instructions (Addendum)
Dr Denman George recommends a follow-up appointment in the fall with Dr Waymond Cera and a follow-up with her in December/January with tumor marker assessment. Please contact Dr Serita Grit office (at [phone]) in October, 2020 to request an appointment with her for January, 2021.  She will refer you to see a plastic surgeon to discuss revision of your abdominal scar.   Dr Denman George recommends you check if you received an HPV vaccine.

## 2019-07-06 NOTE — Progress Notes (Signed)
Follow-up Note: Gyn-Onc  Consult was initially requested by Mandy Gully, Mandy Bauer for the evaluation of Mandy Bauer 18 y.o. female  CC:  Chief Complaint  Patient presents with  . ovarian germ cell tumor    follow-up    Assessment/Plan:  Mandy Bauer  is a 18 y.o.  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 Completed therapy December, 2018.  No evidence of disease on exam or tumor markers.  Will continue surveillance with 3 monthly tumor marker assessments (HCG and LDH). Prescribed OCP use while in surveillance period to avoid pregnancy as cause of elevated HCG.  Continue to alternate visits with Dr Bridgett Larsson in [place] to follow her during the school year. I will see her back during winter break in December/January.  We will refer her to Dr Lorayne Bender for evaluation of her abdominal scar.   HPI: Mandy Bauer is a 18 year old P0 who was seen in consultation at the request of Mandy Gully, Mandy Bauer for a large pelvic mass.  The patient is otherwise very healthy. 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  In May, 2018 with her menstrual period she experienced severe flushing, headaches, palpitations, nausea. This recurred again in June and she was seen at an urgent care. She was recommended to see her OBGYN about starting OCP's for possible menstrual migraines. Her menstrual period was otherwise normal.  She saw Mandy Gully, Mandy Bauer on 06/08/17 and a mass was felt on pelvic examination. This prompted a TVUS to be performed. A large heterogeneous mass was noted extending from the posterior aspect of the uterus to above the umbilicus. Unable to decifer place of origin. Separate ovaries not seen. Increased  vascularity noted within. Area measuring 21.5x15x12.7cm.    She then received an MRI of the abdomen and pelvis on 06/12/17 which revealed a small splenule along medial spleen. She also had a 62m and 11 mm left periaortic lymph nodes identified that were mildly enlarged.  Within the pelvis and extending to the lower abdomen was a 17.8x15.5x7.5cm lobular intermediate T2 signal mass. The mass demonstrated heterogeneous enhancement centrally. No definite fat plane was seen between the mass and posterior uterus. The right ovary is identified and appeared separate to the mass measuring 5cm. The left ovary could not definitively be seen. There is a moderate amount of free fluid in the pelvis and additionally, there were a few enhancing nodules measuring up to 1.9cm in the cul de sac.  CEA was 1.5 CA 125 was 137  Alk Phos was elevated at 196 AFP was 1.2 (normal) HCG was elevated at 344 on 06/16/17 LDH was elevated at 634 on 06/16/17  On 06/18/17 she underwent an exploratory laparotomy, partial omentectomy, biopsy of left ovarian mass. Intraoperative findings included a 20+ cm solid left ovarian tumor densely adherent to the posterior uterus (infiltrating into the uterus/myometrium) with omentum attached anteriorally, mobile but enlarged left PA nodes, no other gross intraperitoneal disease. Due to the inability to resect the mass and preserve uterine fertility a decision was made to biopsy the mass. A segment of omentum that was adherent to the tumor was also removed.  Final pathology confirmed a mixed germ cell tumor with 98% dysgerminoma and  2% yolk sac tumor. The omentum was positive for nodules (microscopic) germ cell tumor. The washings/ascites were positive.  PET/CT on 06/26/17 showed very large pelvic mass compatible with germ cell neoplasm is again identified and exhibits intense heterogeneity FDG uptake at least 1 area of peritoneal nodularity is identified. There is also ascites within the lower  abdomen and pelvis. Suspicious for peritoneal metastases. Borderline enlarged periaortic lymph nodes. Retroperitoneal nodal metastasis cannot be excluded. No evidence for osseous metastasis or metastatic disease to the neck or chest. MRI brain was negative for metastases.  Her case was discussed at multidisciplinary conference and she was recommended to receive 3-4 cycles of BEP chemotherapy prior to reoperation and resection of the left ovary, omentum, nodes.  Cycle 1 of BEP was administered on 07/14/17. Cycle 2 was adminstered on 8/28-08/08/17. Cycle 3 was administered on 08/25/18.  She tolerated therapy well though had some acquired pancytopenia and neutropenia.  MRI on 09/16/17 showed: Resolution of hydronephrosis. Small left periaortic nodes remain, no pelvic sidewall adenopathy. Significant decrease in size of left pelvic mass, measuring 7.9x7.8x5.8cm. It is again intimately associated with the posterior uterine body and fundus. No dominant right ovarian mass.  No omental nodules. New complexity within the cul de sac fluid.   On 10/08/17 she underwent an interval surgery with ex lap, omentectomy, left salpingo-ophorectomy. The ovarian mass measured 11.5cm and was densely adherent to the posterior uterus. However, it was able to be freed from the uterine body without entry into the endometrial cavity. The right ovary was grossly normal and covered with filmy adhesions. There was no gross residual disease remaining at the completion of surgery.  Final pathology revealed only microscopic foci of viable yolk sac tumor <0.1cm in the left ovary. The omentum was negative.  She received 1 additional cycle of BEP (cycle 4) on 11/09/17.   Since surgery she has remained disease free.   Tumor markers were normal (HCG undetectable on 08/24/17 - prior to debulking surgery) and continued <1 s/p chemotherapy on 11/20/17.  02/12/18 HCG was <1  LDH was 165 (normal)  05/13/18: HCG was <1  LDH was 169 (normal)    11/25/18 HCG was <1 LDH was 156 (normal)  02/21/19 HCG was <1 LDH was 166  05/26/19 HCG was <1 LDH was 157  Interval Hx:  The patient has returned to horseback riding. She will go to school near [place].   She is bothered by the abdominal wall contours and disfiguring abdominal incision from her multiple prior surgeries.    Current Meds:  Outpatient Encounter Medications as of 07/06/2019  Medication Sig  . acetaminophen (TYLENOL) 500 MG tablet Take 1 tablet (500 mg total) by mouth every 6 (six) hours as needed.  Marland Kitchen ibuprofen (ADVIL,MOTRIN) 400 MG tablet Take 2 tablets (800 mg total) by mouth every 6 (six) hours as needed for fever.  . norgestimate-ethinyl estradiol (ORTHO-CYCLEN, 28,) 0.25-35 MG-MCG tablet Take 1 tablet by mouth daily.   No facility-administered encounter medications on file as of 07/06/2019.     Allergy: No Known Allergies  Social Hx:   Social History   Socioeconomic History  . Marital status: Single    Spouse name: Not on file  . Number of children: 0  . Years of education: Not on file  . Highest education level: Not on file  Occupational History  . Occupation: Ship broker  Social Needs  . Financial resource strain: Not on file  . Food insecurity    Worry: Not on file  Inability: Not on file  . Transportation needs    Medical: Not on file    Non-medical: Not on file  Tobacco Use  . Smoking status: Never Smoker  . Smokeless tobacco: Never Used  Substance and Sexual Activity  . Alcohol use: No  . Drug use: No  . Sexual activity: Yes  Lifestyle  . Physical activity    Days per week: Not on file    Minutes per session: Not on file  . Stress: Not on file  Relationships  . Social Herbalist on phone: Not on file    Gets together: Not on file    Attends religious service: Not on file    Active member of club or organization: Not on file    Attends meetings of clubs or organizations: Not on file    Relationship status: Not on  file  . Intimate partner violence    Fear of current or ex partner: Not on file    Emotionally abused: Not on file    Physically abused: Not on file    Forced sexual activity: Not on file  Other Topics Concern  . Not on file  Social History Narrative  . Not on file    Past Surgical Hx:  Past Surgical History:  Procedure Laterality Date  . DEBULKING N/A 10/08/2017   Procedure: RADICAL TUMOR DEBULKING;  Surgeon: Everitt Amber, MD;  Location: [facility];  Service: Gynecology;  Laterality: N/A;  . IR FLUORO GUIDE PORT INSERTION RIGHT  06/29/2017  . IR REMOVAL TUN ACCESS W/ PORT W/O FL MOD SED  12/21/2017  . IR US GUIDE VASC ACCESS RIGHT  06/29/2017  . LAPAROTOMY N/A 06/18/2017   Procedure: EXPLORATORY LAPAROTOMY, BIOPSY OF PELVIC MASS;  Surgeon: Everitt Amber, MD;  Location: [facility];  Service: Gynecology;  Laterality: N/A;  . LAPAROTOMY N/A 10/08/2017   Procedure: EXPLORATORY LAPAROTOMY; OMENTECTOMY;  Surgeon: Everitt Amber, MD;  Location: [facility];  Service: Gynecology;  Laterality: N/A;  . SALPINGOOPHORECTOMY Left 10/08/2017   Procedure: LEFT SALPINGO OOPHORECTOMY;  Surgeon: Everitt Amber, MD;  Location: [facility];  Service: Gynecology;  Laterality: Left;  . TONSILLECTOMY  2005    Past Medical Hx:  Past Medical History:  Diagnosis Date  . Migraine   . Pelvic mass in female     Past Gynecological History:  Sexually active. G0. Never had pap. No LMP recorded. Patient has had an injection.  Family Hx:  Family History  Problem Relation Age of Onset  . Hypertension Maternal Grandmother   . Heart disease Maternal Grandmother   . Diabetes Maternal Grandfather   . Lymphoma Maternal Grandfather 45       non-Hodgkin lymphoma    Review of Systems:  Constitutional  Feels well,   Occasional flushing. ENT Normal appearing ears and nares bilaterally Skin/Breast  No rash, sores, jaundice, itching, dryness Cardiovascular  No chest pain, shortness of breath, or edema  Pulmonary  No cough or wheeze.   Gastro Intestinal  No nausea, vomitting, or diarrhoea. No bright red blood per rectum, no abdominal pain, change in bowel movement, or constipation.  Genito Urinary  No frequency, urgency, dysuria, No abnormal bleeding Musculo Skeletal  No myalgia, arthralgia, joint swelling or pain  Neurologic  No weakness, numbness, change in gait,  Psychology  No depression, anxiety, insomnia.   Vitals:  Blood pressure 124/68, pulse 76, temperature 98.5 F (36.9 C), temperature source Oral, resp. rate 19, height _0  (1.676 m), weight 163 lb 12.8  oz (74.3 kg), SpO2 100 %.  Physical Exam: WD in NAD Neck  Supple NROM, without any enlargements.  Lymph Node Survey No cervical supraclavicular or inguinal adenopathy Cardiovascular  Pulse normal rate, regularity and rhythm. S1 and S2 normal.  Lungs  Clear to auscultation bilateraly, without wheezes/crackles/rhonchi. Good air movement.  Skin  No rash/lesions/breakdown  Psychiatry  Alert and oriented to person, place, and time  Abdomen  Normoactive bowel sounds, abdomen soft, non-tender and thin without evidence of hernia. Mass not palpable.  Back No CVA tenderness Genito Urinary: normal external female genitalia. Normal vaginal and cervix. Small mobile uterus and no palpable ovarian masses or cul de sac nodularity.  Rectal  deferred  Extremities  No bilateral cyanosis, clubbing or edema.   Mandy Solo, MD  07/06/2019, 10:24 PM

## 2019-07-07 ENCOUNTER — Telehealth: Payer: Self-pay | Admitting: Oncology

## 2019-07-07 NOTE — Telephone Encounter (Signed)
Faxed referral to Dr. Para Skeans office at [phone].

## 2019-07-22 ENCOUNTER — Telehealth: Payer: Self-pay | Admitting: Oncology

## 2019-07-22 NOTE — Telephone Encounter (Signed)
Mandy Bauer and asked if she has an appointment with Dr. Para Skeans office scheduled.  She said she has been in contact with her office and is trying to find a time when she can leave school for the appointment.

## 2019-08-10 ENCOUNTER — Encounter: Payer: Self-pay | Admitting: Gynecologic Oncology

## 2019-08-10 ENCOUNTER — Other Ambulatory Visit: Payer: Self-pay | Admitting: Gynecologic Oncology

## 2019-08-10 DIAGNOSIS — C562 Malignant neoplasm of left ovary: Secondary | ICD-10-CM

## 2019-08-11 ENCOUNTER — Telehealth: Payer: Self-pay | Admitting: *Deleted

## 2019-08-11 NOTE — Telephone Encounter (Signed)
Called and spoke with the patient, scheduled appts for labs/MD visit

## 2019-09-02 ENCOUNTER — Other Ambulatory Visit: Payer: Self-pay

## 2019-09-02 ENCOUNTER — Inpatient Hospital Stay: Payer: 59 | Attending: Gynecologic Oncology

## 2019-09-02 ENCOUNTER — Inpatient Hospital Stay (HOSPITAL_BASED_OUTPATIENT_CLINIC_OR_DEPARTMENT_OTHER): Payer: 59 | Admitting: Gynecologic Oncology

## 2019-09-02 ENCOUNTER — Encounter: Payer: Self-pay | Admitting: Gynecologic Oncology

## 2019-09-02 VITALS — BP 127/66 | HR 81 | Temp 98.3°F | Resp 16 | Ht 66.0 in | Wt 160.0 lb

## 2019-09-02 DIAGNOSIS — Z90710 Acquired absence of both cervix and uterus: Secondary | ICD-10-CM | POA: Diagnosis not present

## 2019-09-02 DIAGNOSIS — Z8543 Personal history of malignant neoplasm of ovary: Secondary | ICD-10-CM | POA: Insufficient documentation

## 2019-09-02 DIAGNOSIS — Z90721 Acquired absence of ovaries, unilateral: Secondary | ICD-10-CM | POA: Diagnosis not present

## 2019-09-02 DIAGNOSIS — Z23 Encounter for immunization: Secondary | ICD-10-CM

## 2019-09-02 DIAGNOSIS — Z793 Long term (current) use of hormonal contraceptives: Secondary | ICD-10-CM | POA: Diagnosis not present

## 2019-09-02 DIAGNOSIS — Z08 Encounter for follow-up examination after completed treatment for malignant neoplasm: Secondary | ICD-10-CM

## 2019-09-02 DIAGNOSIS — C562 Malignant neoplasm of left ovary: Secondary | ICD-10-CM

## 2019-09-02 DIAGNOSIS — Z9221 Personal history of antineoplastic chemotherapy: Secondary | ICD-10-CM | POA: Insufficient documentation

## 2019-09-02 LAB — LACTATE DEHYDROGENASE: LDH: 166 U/L (ref 98–192)

## 2019-09-02 MED ORDER — INFLUENZA VAC SPLIT QUAD 0.5 ML IM SUSY
PREFILLED_SYRINGE | INTRAMUSCULAR | Status: AC
  Filled 2019-09-02: qty 0.5

## 2019-09-02 MED ORDER — INFLUENZA VAC SPLIT QUAD 0.5 ML IM SUSY
0.5000 mL | PREFILLED_SYRINGE | Freq: Once | INTRAMUSCULAR | Status: AC
  Administered 2019-09-02: 0.5 mL via INTRAMUSCULAR

## 2019-09-02 MED ORDER — ETONOGESTREL-ETHINYL ESTRADIOL 0.12-0.015 MG/24HR VA RING: VAGINAL_RING | VAGINAL | 12 refills | Status: DC

## 2019-09-02 NOTE — Progress Notes (Signed)
Follow-up Note: Gyn-Onc  Consult was initially requested by Elizabeth Lane, NP for the evaluation of Mandy Bauer 18 y.o. female  CC:  Chief Complaint  Patient presents with  . Ovarian Cancer    germ cell tumor follow-up    Assessment/Plan:  Mandy Bauer  is a 18 y.o.  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 Completed therapy December, 2018.  No evidence of disease on exam or tumor markers.  Will continue surveillance with 3 monthly tumor marker assessments (HCG and LDH). Changed prescription to nuvaring which is easier to remember to use while in surveillance period to avoid pregnancy as cause of elevated HCG. If she does not like this form of birth control we will consider an IUD when she returns in the winter.   I will see her back during winter break in December/January.  We provided flu vax today, however I explained to the patient that we do not have HPV or other vaccinations available here in the cancer center.  I recommended that she return to see Beth Lane her nurse practitioner at [facility] to discuss these additional vaccinations.  HPI: Mandy Bauer is a 18 year old P0 who was seen in consultation at the request of Elizabeth Lane, NP for a large pelvic mass.  The patient is otherwise very healthy. 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  In May, 2018 with her menstrual period she experienced severe flushing, headaches, palpitations, nausea. This recurred again in June and she was seen at an urgent care. She was recommended to see her OBGYN about starting OCP's for possible menstrual migraines. Her menstrual period was otherwise normal.  She saw Elizabeth Lane, NP on 06/08/17 and  a mass was felt on pelvic examination. This prompted a TVUS to be performed. A large heterogeneous mass was noted extending from the posterior aspect of the uterus to above the umbilicus. Unable to decifer place of origin. Separate ovaries not seen. Increased vascularity noted within. Area measuring 21.5x15x12.7cm.    She then received an MRI of the abdomen and pelvis on 06/12/17 which revealed a small splenule along medial spleen. She also had a 12mm and 11 mm left periaortic lymph nodes identified that were mildly enlarged.  Within the pelvis and extending to the lower abdomen was a 17.8x15.5x7.5cm lobular intermediate T2 signal mass. The mass demonstrated heterogeneous enhancement centrally. No definite fat plane was seen between the mass and posterior uterus. The right ovary is identified and appeared separate to the mass measuring 5cm. The left ovary could not definitively be seen. There is a moderate amount of free fluid in the pelvis and additionally, there were a few enhancing nodules measuring up to 1.9cm in the cul de sac.  CEA was 1.5 CA 125 was 137  Alk Phos was elevated at 196 AFP was 1.2 (normal) HCG was elevated at 344 on 06/16/17 LDH was elevated at 634 on 06/16/17  On 06/18/17 she underwent an exploratory laparotomy, partial omentectomy, biopsy of left ovarian mass. Intraoperative findings included a 20+ cm solid left ovarian tumor densely adherent to the posterior uterus (infiltrating into the uterus/myometrium) with omentum attached anteriorally, mobile but enlarged left PA nodes, no other gross intraperitoneal disease.   Due to the inability to resect the mass and preserve uterine fertility a decision was made to biopsy the mass. A segment of omentum that was adherent to the tumor was also removed.  Final pathology confirmed a mixed germ cell tumor with 98% dysgerminoma and 2% yolk sac tumor. The omentum was positive for nodules (microscopic) germ cell tumor. The washings/ascites were  positive.  PET/CT on 06/26/17 showed very large pelvic mass compatible with germ cell neoplasm is again identified and exhibits intense heterogeneity FDG uptake at least 1 area of peritoneal nodularity is identified. There is also ascites within the lower abdomen and pelvis. Suspicious for peritoneal metastases. Borderline enlarged periaortic lymph nodes. Retroperitoneal nodal metastasis cannot be excluded. No evidence for osseous metastasis or metastatic disease to the neck or chest. MRI brain was negative for metastases.  Her case was discussed at multidisciplinary conference and she was recommended to receive 3-4 cycles of BEP chemotherapy prior to reoperation and resection of the left ovary, omentum, nodes.  Cycle 1 of BEP was administered on 07/14/17. Cycle 2 was adminstered on 8/28-08/08/17. Cycle 3 was administered on 08/25/18.  She tolerated therapy well though had some acquired pancytopenia and neutropenia.  MRI on 09/16/17 showed: Resolution of hydronephrosis. Small left periaortic nodes remain, no pelvic sidewall adenopathy. Significant decrease in size of left pelvic mass, measuring 7.9x7.8x5.8cm. It is again intimately associated with the posterior uterine body and fundus. No dominant right ovarian mass.  No omental nodules. New complexity within the cul de sac fluid.   On 10/08/17 she underwent an interval surgery with ex lap, omentectomy, left salpingo-ophorectomy. The ovarian mass measured 11.5cm and was densely adherent to the posterior uterus. However, it was able to be freed from the uterine body without entry into the endometrial cavity. The right ovary was grossly normal and covered with filmy adhesions. There was no gross residual disease remaining at the completion of surgery.  Final pathology revealed only microscopic foci of viable yolk sac tumor <0.1cm in the left ovary. The omentum was negative.  She received 1 additional cycle of BEP (cycle 4) on 11/09/17.   Since surgery  she has remained disease free.   Tumor markers were normal (HCG undetectable on 08/24/17 - prior to debulking surgery) and continued <1 s/p chemotherapy on 11/20/17.  02/12/18 HCG was <1  LDH was 165 (normal)  05/13/18: HCG was <1  LDH was 169 (normal)   11/25/18 HCG was <1 LDH was 156 (normal)  02/21/19 HCG was <1 LDH was 166  05/26/19 HCG was <1 LDH was 157  09/02/19 HCG was pending LDH was pending  Interval Hx:  The patient has returned to horseback riding. She goes to school near [place].   She is bothered by the abdominal wall contours and saw Dr.Thimmappa for evaluation for potential scar revision.  However it was felt that the risks and cosmetic uncertainty of a revision were not justified by the current appearance of the scar.  She desires HPV vaccination, flu vaccination, and all other vaccinations that she is scheduled for.  She is interested in changing her birth control regimen as she is experiencing some depressive symptoms on her current oral contraceptive pill.  She is interested in a long-acting reversible contraceptive as she finds it difficult to remember to take her oral contraceptive daily.  Current Meds:  Outpatient Encounter Medications as of 09/02/2019  Medication Sig  . acetaminophen (TYLENOL) 500 MG tablet Take 1 tablet (500 mg total) by mouth every 6 (six)   hours as needed.  . etonogestrel-ethinyl estradiol (NUVARING) 0.12-0.015 MG/24HR vaginal ring Insert vaginally and leave in place for 3 consecutive weeks, then remove for 1 week.  Marland Kitchen ibuprofen (ADVIL,MOTRIN) 400 MG tablet Take 2 tablets (800 mg total) by mouth every 6 (six) hours as needed for fever.  . [DISCONTINUED] norgestimate-ethinyl estradiol (ORTHO-CYCLEN, 28,) 0.25-35 MG-MCG tablet Take 1 tablet by mouth daily.  . [EXPIRED] influenza vac split quadrivalent PF (FLUARIX) injection 0.5 mL    No facility-administered encounter medications on file as of 09/02/2019.     Allergy: No Known  Allergies  Social Hx:   Social History   Socioeconomic History  . Marital status: Single    Spouse name: Not on file  . Number of children: 0  . Years of education: Not on file  . Highest education level: Not on file  Occupational History  . Occupation: Ship broker  Social Needs  . Financial resource strain: Not on file  . Food insecurity    Worry: Not on file    Inability: Not on file  . Transportation needs    Medical: Not on file    Non-medical: Not on file  Tobacco Use  . Smoking status: Never Smoker  . Smokeless tobacco: Never Used  Substance and Sexual Activity  . Alcohol use: No  . Drug use: No  . Sexual activity: Yes  Lifestyle  . Physical activity    Days per week: Not on file    Minutes per session: Not on file  . Stress: Not on file  Relationships  . Social Herbalist on phone: Not on file    Gets together: Not on file    Attends religious service: Not on file    Active member of club or organization: Not on file    Attends meetings of clubs or organizations: Not on file    Relationship status: Not on file  . Intimate partner violence    Fear of current or ex partner: Not on file    Emotionally abused: Not on file    Physically abused: Not on file    Forced sexual activity: Not on file  Other Topics Concern  . Not on file  Social History Narrative  . Not on file    Past Surgical Hx:  Past Surgical History:  Procedure Laterality Date  . DEBULKING N/A 10/08/2017   Procedure: RADICAL TUMOR DEBULKING;  Surgeon: Everitt Amber, MD;  Location: [facility];  Service: Gynecology;  Laterality: N/A;  . IR FLUORO GUIDE PORT INSERTION RIGHT  06/29/2017  . IR REMOVAL TUN ACCESS W/ PORT W/O FL MOD SED  12/21/2017  . IR US GUIDE VASC ACCESS RIGHT  06/29/2017  . LAPAROTOMY N/A 06/18/2017   Procedure: EXPLORATORY LAPAROTOMY, BIOPSY OF PELVIC MASS;  Surgeon: Everitt Amber, MD;  Location: [facility];  Service: Gynecology;  Laterality: N/A;  . LAPAROTOMY N/A 10/08/2017    Procedure: EXPLORATORY LAPAROTOMY; OMENTECTOMY;  Surgeon: Everitt Amber, MD;  Location: [facility];  Service: Gynecology;  Laterality: N/A;  . SALPINGOOPHORECTOMY Left 10/08/2017   Procedure: LEFT SALPINGO OOPHORECTOMY;  Surgeon: Everitt Amber, MD;  Location: [facility];  Service: Gynecology;  Laterality: Left;  . TONSILLECTOMY  2005    Past Medical Hx:  Past Medical History:  Diagnosis Date  . Migraine   . Pelvic mass in female     Past Gynecological History:  Sexually active. G0. Never had pap. No LMP recorded. Patient has had an injection.  Family Hx:  Family History  Problem Relation Age of Onset  . Hypertension Maternal Grandmother   . Heart disease Maternal Grandmother   . Diabetes Maternal Grandfather   . Lymphoma Maternal Grandfather 3       non-Hodgkin lymphoma    Review of Systems:  Constitutional  Feels well,   Occasional flushing. ENT Normal appearing ears and nares bilaterally Skin/Breast  No rash, sores, jaundice, itching, dryness Cardiovascular  No chest pain, shortness of breath, or edema  Pulmonary  No cough or wheeze.  Gastro Intestinal  No nausea, vomitting, or diarrhoea. No bright red blood per rectum, no abdominal pain, change in bowel movement, or constipation.  Genito Urinary  No frequency, urgency, dysuria, No abnormal bleeding Musculo Skeletal  No myalgia, arthralgia, joint swelling or pain  Neurologic  No weakness, numbness, change in gait,  Psychology  No depression, anxiety, insomnia.   Vitals:  Blood pressure 127/66, pulse 81, temperature 98.3 F (36.8 C), temperature source Temporal, resp. rate 16, height 5' 6" (1.676 m), weight 160 lb (72.6 kg), SpO2 100 %.  Physical Exam: WD in NAD Neck  Supple NROM, without any enlargements.  Lymph Node Survey No cervical supraclavicular or inguinal adenopathy Cardiovascular  Pulse normal rate, regularity and rhythm. S1 and S2 normal.  Lungs  Clear to auscultation bilateraly, without  wheezes/crackles/rhonchi. Good air movement.  Skin  No rash/lesions/breakdown  Psychiatry  Alert and oriented to person, place, and time  Abdomen  Normoactive bowel sounds, abdomen soft, non-tender and thin without evidence of hernia. Mass not palpable.  Back No CVA tenderness Genito Urinary: normal external female genitalia. Normal vaginal and cervix. Small mobile uterus and no palpable ovarian masses or cul de sac nodularity.  Rectal  deferred  Extremities  No bilateral cyanosis, clubbing or edema.   Mandy Solo, MD  09/02/2019, 5:18 PM

## 2019-09-02 NOTE — Patient Instructions (Addendum)
Dr Denman George is recommending stopping the birth control tablets and changing to nuvaring. It is inserted into the vagina once every 3 weeks, then removed and replaced with a new ring. If you take it out for a week you will get a period within the week. If you do not desire a period, you can take one ring out and replace immediately with the next. You can have sex (intercourse) with the ring in place, though if preferred you can remove for sex.  If the ring is out for more than 24 hours you can get pregnant and therefore should use an alternative additional form of contraception (eg condoms) for 2 weeks after replacing the ring.   Dr Denman George will give you the flu vaccine today.  Dr Serita Grit office will reach out to [facility] to obtain a course of HPV vaccination and other make-up vaccines.  Dr Denman George will see you back in 3 months for a visit and tumor markers.   Call [facility] [phone]  to set up gardasil injections

## 2019-09-03 LAB — BETA HCG QUANT (REF LAB): hCG Quant: <1 m[IU]/mL

## 2019-11-29 ENCOUNTER — Inpatient Hospital Stay: Payer: 59

## 2019-11-29 ENCOUNTER — Encounter: Payer: Self-pay | Admitting: Gynecologic Oncology

## 2019-11-29 ENCOUNTER — Inpatient Hospital Stay: Payer: 59 | Attending: Gynecologic Oncology | Admitting: Gynecologic Oncology

## 2019-11-29 ENCOUNTER — Other Ambulatory Visit: Payer: 59

## 2019-11-29 ENCOUNTER — Other Ambulatory Visit: Payer: Self-pay

## 2019-11-29 VITALS — BP 121/75 | HR 58 | Temp 98.5°F | Resp 17 | Ht 66.0 in | Wt 159.2 lb

## 2019-11-29 DIAGNOSIS — Z9221 Personal history of antineoplastic chemotherapy: Secondary | ICD-10-CM | POA: Insufficient documentation

## 2019-11-29 DIAGNOSIS — R590 Localized enlarged lymph nodes: Secondary | ICD-10-CM | POA: Diagnosis not present

## 2019-11-29 DIAGNOSIS — C562 Malignant neoplasm of left ovary: Secondary | ICD-10-CM | POA: Diagnosis not present

## 2019-11-29 DIAGNOSIS — Z8543 Personal history of malignant neoplasm of ovary: Secondary | ICD-10-CM | POA: Diagnosis not present

## 2019-11-29 LAB — LACTATE DEHYDROGENASE: LDH: 168 U/L (ref 98–192)

## 2019-11-29 MED ORDER — ETONOGESTREL-ETHINYL ESTRADIOL 0.12-0.015 MG/24HR VA RING: VAGINAL_RING | VAGINAL | 3 refills | Status: DC

## 2019-11-29 NOTE — Patient Instructions (Signed)
Dr Denman George has refilled your nuvaring to add the info to immediately replace the ring after 3 weeks.  Dr Denman George has ordered labs for 3 months (please call [phone] when you know when you will be in town and we will schedule a lab appointment for you).  Please return to see Dr Denman George as scheduled in June.  Dr Denman George will enquire about the best form of imaging for your neck lymph node and let you know and get this scheduled.

## 2019-11-29 NOTE — Progress Notes (Signed)
Follow-up Note: Gyn-Onc  Consult was initially requested by Avon Gully, NP for the evaluation of [name] 18 y.o. female  CC:  Chief Complaint  Patient presents with  . Dysgerminoma of left ovary [facility])    Assessment/Plan:  Ms. LEEASIA SECRIST  is a 18 y.o.  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 Completed therapy December, 2018.  Left upper cervical node that is palpable - will obtain imaging, low suspicion it is recurrent disease.  No evidence of disease on exam or tumor markers.  Will continue surveillance with 3 monthly tumor marker assessments (HCG and LDH). Nuvaring -refilled today.   I will see her back during summer break in June, she will get labs in the interim in March.  HPI: Zuriel Yeaman is a 18 year old P0 who was seen in consultation at the request of Avon Gully, NP for a large pelvic mass.  The patient is otherwise very healthy. 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  In May, 2018 with her menstrual period she experienced severe flushing, headaches, palpitations, nausea. This recurred again in June and she was seen at an urgent care. She was recommended to see her OBGYN about starting OCP's for possible menstrual migraines. Her menstrual period was otherwise normal.  She saw Avon Gully, NP on 06/08/17 and a mass was felt on pelvic examination. This prompted a TVUS to be performed. A large heterogeneous mass was noted extending from the posterior aspect of the uterus to above the umbilicus. Unable to decifer place of origin. Separate ovaries not seen. Increased vascularity noted within. Area measuring 21.5x15x12.7cm.    She then received an MRI of the abdomen and pelvis on  06/12/17 which revealed a small splenule along medial spleen. She also had a 23m and 11 mm left periaortic lymph nodes identified that were mildly enlarged.  Within the pelvis and extending to the lower abdomen was a 17.8x15.5x7.5cm lobular intermediate T2 signal mass. The mass demonstrated heterogeneous enhancement centrally. No definite fat plane was seen between the mass and posterior uterus. The right ovary is identified and appeared separate to the mass measuring 5cm. The left ovary could not definitively be seen. There is a moderate amount of free fluid in the pelvis and additionally, there were a few enhancing nodules measuring up to 1.9cm in the cul de sac.  CEA was 1.5 CA 125 was 137  Alk Phos was elevated at 196 AFP was 1.2 (normal) HCG was elevated at 344 on 06/16/17 LDH was elevated at 634 on 06/16/17  On 06/18/17 she underwent an exploratory laparotomy, partial omentectomy, biopsy of left ovarian mass. Intraoperative findings included a 20+ cm solid left ovarian tumor densely adherent to the posterior uterus (infiltrating into the uterus/myometrium) with omentum attached anteriorally, mobile but enlarged left PA nodes, no other gross intraperitoneal disease. Due to the inability to resect the mass and preserve uterine fertility a decision was made to biopsy the mass. A segment of omentum that was adherent to the tumor was also removed.  Final pathology confirmed a mixed germ cell tumor with 98% dysgerminoma and 2% yolk sac tumor. The omentum was positive for nodules (microscopic) germ cell tumor. The washings/ascites were positive.  PET/CT  on 06/26/17 showed very large pelvic mass compatible with germ cell neoplasm is again identified and exhibits intense heterogeneity FDG uptake at least 1 area of peritoneal nodularity is identified. There is also ascites within the lower abdomen and pelvis. Suspicious for peritoneal metastases. Borderline enlarged periaortic lymph nodes. Retroperitoneal  nodal metastasis cannot be excluded. No evidence for osseous metastasis or metastatic disease to the neck or chest. MRI brain was negative for metastases.  Her case was discussed at multidisciplinary conference and she was recommended to receive 3-4 cycles of BEP chemotherapy prior to reoperation and resection of the left ovary, omentum, nodes.  Cycle 1 of BEP was administered on 07/14/17. Cycle 2 was adminstered on 8/28-08/08/17. Cycle 3 was administered on 20/18/19.  She tolerated therapy well though had some acquired pancytopenia and neutropenia.  MRI on 09/16/17 showed: Resolution of hydronephrosis. Small left periaortic nodes remain, no pelvic sidewall adenopathy. Significant decrease in size of left pelvic mass, measuring 7.9x7.8x5.8cm. It is again intimately associated with the posterior uterine body and fundus. No dominant right ovarian mass.  No omental nodules. New complexity within the cul de sac fluid.   On 10/08/17 she underwent an interval surgery with ex lap, omentectomy, left salpingo-ophorectomy. The ovarian mass measured 11.5cm and was densely adherent to the posterior uterus. However, it was able to be freed from the uterine body without entry into the endometrial cavity. The right ovary was grossly normal and covered with filmy adhesions. There was no gross residual disease remaining at the completion of surgery.  Final pathology revealed only microscopic foci of viable yolk sac tumor <0.1cm in the left ovary. The omentum was negative.  She received 1 additional cycle of BEP (cycle 4) on 11/09/17.   Since surgery she has remained disease free.   Tumor markers were normal (HCG undetectable on 08/24/17 - prior to debulking surgery) and continued <1 s/p chemotherapy on 11/20/17.  02/12/18 HCG was <1  LDH was 165 (normal)  05/13/18: HCG was <1  LDH was 169 (normal)   11/25/18 HCG was <1 LDH was 156 (normal)  02/21/19 HCG was <1 LDH was 166  05/26/19 HCG was <1 LDH was  157  09/02/19 HCG was <1 LDH was 166  11/29/19 HCG was pending LDH was pending  Interval Hx:  She was prescribed nuvaring at the visit in Fall, 2020 and she likes this form of birth control.  She has a palpable left upper cervical neck node that is worrisome to her. It changes in size intermittently.   Current Meds:  Outpatient Encounter Medications as of 11/29/2019  Medication Sig  . acetaminophen (TYLENOL) 500 MG tablet Take 1 tablet (500 mg total) by mouth every 6 (six) hours as needed.  . etonogestrel-ethinyl estradiol (NUVARING) 0.12-0.015 MG/24HR vaginal ring Insert vaginally and leave in place for 3 consecutive weeks, then remove and replace immediately with new ring  . ibuprofen (ADVIL,MOTRIN) 400 MG tablet Take 2 tablets (800 mg total) by mouth every 6 (six) hours as needed for fever.  . [DISCONTINUED] etonogestrel-ethinyl estradiol (NUVARING) 0.12-0.015 MG/24HR vaginal ring Insert vaginally and leave in place for 3 consecutive weeks, then remove for 1 week.   No facility-administered encounter medications on file as of 11/29/2019.    Allergy: No Known Allergies  Social Hx:   Social History   Socioeconomic History  . Marital status: Single    Spouse name: Not on file  . Number of children: 0  . Years of education: Not on file  . Highest  education level: Not on file  Occupational History  . Occupation: Ship broker  Tobacco Use  . Smoking status: Never Smoker  . Smokeless tobacco: Never Used  Substance and Sexual Activity  . Alcohol use: No  . Drug use: No  . Sexual activity: Yes  Other Topics Concern  . Not on file  Social History Narrative  . Not on file   Social Determinants of Health   Financial Resource Strain:   . Difficulty of Paying Living Expenses: Not on file  Food Insecurity:   . Worried About Charity fundraiser in the Last Year: Not on file  . Ran Out of Food in the Last Year: Not on file  Transportation Needs:   . Lack of Transportation  (Medical): Not on file  . Lack of Transportation (Non-Medical): Not on file  Physical Activity:   . Days of Exercise per Week: Not on file  . Minutes of Exercise per Session: Not on file  Stress:   . Feeling of Stress : Not on file  Social Connections:   . Frequency of Communication with Friends and Family: Not on file  . Frequency of Social Gatherings with Friends and Family: Not on file  . Attends Religious Services: Not on file  . Active Member of Clubs or Organizations: Not on file  . Attends Archivist Meetings: Not on file  . Marital Status: Not on file  Intimate Partner Violence:   . Fear of Current or Ex-Partner: Not on file  . Emotionally Abused: Not on file  . Physically Abused: Not on file  . Sexually Abused: Not on file    Past Surgical Hx:  Past Surgical History:  Procedure Laterality Date  . DEBULKING N/A 10/08/2017   Procedure: RADICAL TUMOR DEBULKING;  Surgeon: Everitt Amber, MD;  Location: [facility];  Service: Gynecology;  Laterality: N/A;  . IR FLUORO GUIDE PORT INSERTION RIGHT  06/29/2017  . IR REMOVAL TUN ACCESS W/ PORT W/O FL MOD SED  12/21/2017  . IR US GUIDE VASC ACCESS RIGHT  06/29/2017  . LAPAROTOMY N/A 06/18/2017   Procedure: EXPLORATORY LAPAROTOMY, BIOPSY OF PELVIC MASS;  Surgeon: Everitt Amber, MD;  Location: [facility];  Service: Gynecology;  Laterality: N/A;  . LAPAROTOMY N/A 10/08/2017   Procedure: EXPLORATORY LAPAROTOMY; OMENTECTOMY;  Surgeon: Everitt Amber, MD;  Location: [facility];  Service: Gynecology;  Laterality: N/A;  . SALPINGOOPHORECTOMY Left 10/08/2017   Procedure: LEFT SALPINGO OOPHORECTOMY;  Surgeon: Everitt Amber, MD;  Location: [facility];  Service: Gynecology;  Laterality: Left;  . TONSILLECTOMY  2005    Past Medical Hx:  Past Medical History:  Diagnosis Date  . Migraine   . Pelvic mass in female     Past Gynecological History:  Sexually active. G0. Never had pap. No LMP recorded. Patient has had an injection.  Family Hx:  Family History   Problem Relation Age of Onset  . Hypertension Maternal Grandmother   . Heart disease Maternal Grandmother   . Diabetes Maternal Grandfather   . Lymphoma Maternal Grandfather 66       non-Hodgkin lymphoma    Review of Systems:  Constitutional  Feels well,   Occasional flushing. ENT Normal appearing ears and nares bilaterally Skin/Breast  No rash, sores, jaundice, itching, dryness Cardiovascular  No chest pain, shortness of breath, or edema  Pulmonary  No cough or wheeze.  Gastro Intestinal  No nausea, vomitting, or diarrhoea. No bright red blood per rectum, no abdominal pain, change in bowel movement, or  constipation.  Genito Urinary  No frequency, urgency, dysuria, No abnormal bleeding Musculo Skeletal  No myalgia, arthralgia, joint swelling or pain  Neurologic  No weakness, numbness, change in gait,  Psychology  No depression, anxiety, insomnia.   Vitals:  Blood pressure 121/75, pulse (!) 58, temperature 98.5 F (36.9 C), temperature source Temporal, resp. rate 17, height 5' 6"  (1.676 m), weight 159 lb 3.2 oz (72.2 kg), SpO2 100 %.  Physical Exam: WD in NAD Neck  Supple NROM, without any enlargements.  Lymph Node Survey No supraclavicular or inguinal adenopathy. A 1cm mobile rubbery node is palpable in between the strap muscles of the left upper cervical region approximating the mastoid process. No surrounding skin changes or sources of infection visible.  Cardiovascular  Pulse normal rate, regularity and rhythm. S1 and S2 normal.  Lungs  Clear to auscultation bilateraly, without wheezes/crackles/rhonchi. Good air movement.  Skin  No rash/lesions/breakdown  Psychiatry  Alert and oriented to person, place, and time  Abdomen  Normoactive bowel sounds, abdomen soft, non-tender and thin without evidence of hernia. Mass not palpable.  Back No CVA tenderness Genito Urinary: normal external female genitalia. Normal vaginal and cervix. Small mobile uterus and no  palpable ovarian masses or cul de sac nodularity.  Rectal  deferred  Extremities  No bilateral cyanosis, clubbing or edema.   Thereasa Solo, MD  11/29/2019, 9:58 AM

## 2019-11-30 LAB — BETA HCG QUANT (REF LAB): hCG Quant: <1 m[IU]/mL

## 2019-12-06 ENCOUNTER — Other Ambulatory Visit: Payer: Self-pay | Admitting: Gynecologic Oncology

## 2019-12-06 ENCOUNTER — Telehealth: Payer: Self-pay | Admitting: *Deleted

## 2019-12-06 ENCOUNTER — Ambulatory Visit: Payer: 59 | Attending: Internal Medicine

## 2019-12-06 DIAGNOSIS — R591 Generalized enlarged lymph nodes: Secondary | ICD-10-CM

## 2019-12-06 DIAGNOSIS — C562 Malignant neoplasm of left ovary: Secondary | ICD-10-CM

## 2019-12-06 DIAGNOSIS — Z20822 Contact with and (suspected) exposure to covid-19: Secondary | ICD-10-CM

## 2019-12-06 NOTE — Telephone Encounter (Signed)
Scheduled patient for CT neck on 1/18. Called and gave the patient appt date/time and instructions

## 2019-12-06 NOTE — Progress Notes (Signed)
Ct neck with contrast per Dr. Alvy Bimler to evaluate left upper cervical node.

## 2019-12-07 LAB — NOVEL CORONAVIRUS, NAA: SARS-CoV-2, NAA: NOT DETECTED

## 2019-12-26 ENCOUNTER — Encounter (HOSPITAL_COMMUNITY): Payer: Self-pay

## 2019-12-26 ENCOUNTER — Other Ambulatory Visit: Payer: Self-pay

## 2019-12-26 ENCOUNTER — Telehealth: Payer: Self-pay

## 2019-12-26 ENCOUNTER — Ambulatory Visit (HOSPITAL_COMMUNITY)
Admission: RE | Admit: 2019-12-26 | Discharge: 2019-12-26 | Disposition: A | Discharge status: Home / Self Care | Payer: BC Managed Care – PPO | Source: Ambulatory Visit | Attending: Gynecologic Oncology | Admitting: Gynecologic Oncology

## 2019-12-26 DIAGNOSIS — R591 Generalized enlarged lymph nodes: Secondary | ICD-10-CM

## 2019-12-26 DIAGNOSIS — C562 Malignant neoplasm of left ovary: Secondary | ICD-10-CM

## 2019-12-26 HISTORY — DX: Malignant (primary) neoplasm, unspecified: C80.1

## 2019-12-26 MED ORDER — IOHEXOL 300 MG/ML  SOLN
75.0000 mL | Freq: Once | INTRAMUSCULAR | Status: AC | PRN
  Administered 2019-12-26: 09:00:00 75 mL via INTRAVENOUS

## 2019-12-26 MED ORDER — SODIUM CHLORIDE (PF) 0.9 % IJ SOLN
INTRAMUSCULAR | Status: AC
  Filled 2019-12-26: qty 50

## 2019-12-26 NOTE — Telephone Encounter (Signed)
Told Ms Mandy Bauer the results of the scan as noted below.

## 2019-12-26 NOTE — Telephone Encounter (Signed)
LM for Ms Boecker to call back to the office to discuss the results of the CT scan.  Will inform patient that Mandy John, NP said that no pathologic adenopathy in neck. Bilateral cervical lymph nodes not pathologically enlarged.  No mass or lesion in the neck.

## 2020-02-03 ENCOUNTER — Telehealth: Payer: Self-pay

## 2020-02-03 NOTE — Telephone Encounter (Signed)
Scheduled Ms Hermann for 02-15-20 @ 0930 for 3 month labs per Dr. Serita Grit note 11-29-19.

## 2020-02-15 ENCOUNTER — Other Ambulatory Visit: Payer: Self-pay

## 2020-02-15 ENCOUNTER — Inpatient Hospital Stay: Payer: BC Managed Care – PPO | Attending: Gynecologic Oncology

## 2020-02-15 DIAGNOSIS — R591 Generalized enlarged lymph nodes: Secondary | ICD-10-CM

## 2020-02-15 DIAGNOSIS — Z3202 Encounter for pregnancy test, result negative: Secondary | ICD-10-CM | POA: Insufficient documentation

## 2020-02-15 DIAGNOSIS — C562 Malignant neoplasm of left ovary: Secondary | ICD-10-CM

## 2020-02-15 DIAGNOSIS — Z8543 Personal history of malignant neoplasm of ovary: Secondary | ICD-10-CM | POA: Insufficient documentation

## 2020-02-15 LAB — LACTATE DEHYDROGENASE: LDH: 166 U/L (ref 98–192)

## 2020-02-15 LAB — PREGNANCY, URINE: Preg Test, Ur: NEGATIVE

## 2020-02-16 LAB — BETA HCG QUANT (REF LAB): hCG Quant: <1 m[IU]/mL

## 2020-03-01 ENCOUNTER — Other Ambulatory Visit: Payer: Self-pay | Admitting: Obstetrics and Gynecology

## 2020-03-01 DIAGNOSIS — Z6827 Body mass index (BMI) 27.0-27.9, adult: Secondary | ICD-10-CM | POA: Diagnosis not present

## 2020-03-01 DIAGNOSIS — Z118 Encounter for screening for other infectious and parasitic diseases: Secondary | ICD-10-CM | POA: Diagnosis not present

## 2020-03-01 DIAGNOSIS — Z01419 Encounter for gynecological examination (general) (routine) without abnormal findings: Secondary | ICD-10-CM | POA: Diagnosis not present

## 2020-05-10 ENCOUNTER — Inpatient Hospital Stay: Payer: BC Managed Care – PPO | Attending: Gynecologic Oncology | Admitting: Gynecologic Oncology

## 2020-05-10 ENCOUNTER — Other Ambulatory Visit: Payer: Self-pay

## 2020-05-10 ENCOUNTER — Inpatient Hospital Stay: Payer: BC Managed Care – PPO

## 2020-05-10 VITALS — BP 121/60 | HR 51 | Temp 97.8°F | Resp 20 | Ht 66.0 in | Wt 162.8 lb

## 2020-05-10 DIAGNOSIS — R55 Syncope and collapse: Secondary | ICD-10-CM | POA: Insufficient documentation

## 2020-05-10 DIAGNOSIS — Z90721 Acquired absence of ovaries, unilateral: Secondary | ICD-10-CM | POA: Insufficient documentation

## 2020-05-10 DIAGNOSIS — C562 Malignant neoplasm of left ovary: Secondary | ICD-10-CM

## 2020-05-10 DIAGNOSIS — Z9221 Personal history of antineoplastic chemotherapy: Secondary | ICD-10-CM | POA: Diagnosis not present

## 2020-05-10 DIAGNOSIS — Z9079 Acquired absence of other genital organ(s): Secondary | ICD-10-CM | POA: Diagnosis not present

## 2020-05-10 LAB — CBC WITH DIFFERENTIAL (CANCER CENTER ONLY)
Abs Immature Granulocytes: 0.01 10*3/uL (ref 0.00–0.07)
Basophils Absolute: 0.1 10*3/uL (ref 0.0–0.1)
Basophils Relative: 1 %
Eosinophils Absolute: 0.2 10*3/uL (ref 0.0–0.5)
Eosinophils Relative: 2 %
HCT: 42.3 % (ref 36.0–46.0)
Hemoglobin: 14.6 g/dL (ref 12.0–15.0)
Immature Granulocytes: 0 %
Lymphocytes Relative: 40 %
Lymphs Abs: 2.5 10*3/uL (ref 0.7–4.0)
MCH: 31.2 pg (ref 26.0–34.0)
MCHC: 34.5 g/dL (ref 30.0–36.0)
MCV: 90.4 fL (ref 80.0–100.0)
Monocytes Absolute: 0.5 10*3/uL (ref 0.1–1.0)
Monocytes Relative: 8 %
Neutro Abs: 3 10*3/uL (ref 1.7–7.7)
Neutrophils Relative %: 49 %
Platelet Count: 200 10*3/uL (ref 150–400)
RBC: 4.68 MIL/uL (ref 3.87–5.11)
RDW: 11.9 % (ref 11.5–15.5)
WBC Count: 6.2 10*3/uL (ref 4.0–10.5)
nRBC: 0 % (ref 0.0–0.2)

## 2020-05-10 LAB — LACTATE DEHYDROGENASE: LDH: 175 U/L (ref 98–192)

## 2020-05-10 NOTE — Progress Notes (Signed)
Follow-up Note: Gyn-Onc  Consult was initially requested by Avon Gully, NP for the evaluation of Mandy Bauer 19 y.o. female  CC:  Chief Complaint  Patient presents with  . Dysgerminoma of left ovary ([facility])    Follow Up    Assessment/Plan:  Mandy Bauer  is a 19 y.o.  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 Completed therapy December, 2018.  Unexplained syncope without evidence of neuro symptoms - recommend head CT.   No evidence of disease on exam or tumor markers.  Will continue surveillance with 3 monthly tumor marker assessments (HCG and LDH).   I will see her back during winter break in December.  HPI: Mandy Bauer is a 19 year old P0 who was seen in consultation at the request of Avon Gully, NP for a large pelvic mass.  The patient is otherwise very healthy. 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  In May, 2018 with her menstrual period she experienced severe flushing, headaches, palpitations, nausea. This recurred again in June and she was seen at an urgent care. She was recommended to see her OBGYN about starting OCP's for possible menstrual migraines. Her menstrual period was otherwise normal.  She saw Avon Gully, NP on 06/08/17 and a mass was felt on pelvic examination. This prompted a TVUS to be performed. A large heterogeneous mass was noted extending from the posterior aspect of the uterus to above the umbilicus. Unable to decifer place of origin. Separate ovaries not seen. Increased vascularity noted within. Area measuring 21.5x15x12.7cm.    She then received an MRI of the abdomen and pelvis on 06/12/17 which revealed a small splenule along medial spleen. She also had a  58m and 11 mm left periaortic lymph nodes identified that were mildly enlarged.  Within the pelvis and extending to the lower abdomen was a 17.8x15.5x7.5cm lobular intermediate T2 signal mass. The mass demonstrated heterogeneous enhancement centrally. No definite fat plane was seen between the mass and posterior uterus. The right ovary is identified and appeared separate to the mass measuring 5cm. The left ovary could not definitively be seen. There is a moderate amount of free fluid in the pelvis and additionally, there were a few enhancing nodules measuring up to 1.9cm in the cul de sac.  CEA was 1.5 CA 125 was 137  Alk Phos was elevated at 196 AFP was 1.2 (normal) HCG was elevated at 344 on 06/16/17 LDH was elevated at 634 on 06/16/17  On 06/18/17 she underwent an exploratory laparotomy, partial omentectomy, biopsy of left ovarian mass. Intraoperative findings included a 20+ cm solid left ovarian tumor densely adherent to the posterior uterus (infiltrating into the uterus/myometrium) with omentum attached anteriorally, mobile but enlarged left PA nodes, no other gross intraperitoneal disease. Due to the inability to resect the mass and preserve uterine fertility a decision was made to biopsy the mass. A segment of omentum that was adherent to the tumor was also removed.  Final pathology confirmed a mixed germ cell tumor with 98% dysgerminoma and 2% yolk sac tumor. The omentum was positive for nodules (microscopic) germ cell tumor. The washings/ascites were positive.  PET/CT on 06/26/17 showed very large pelvic mass compatible with germ cell neoplasm  is again identified and exhibits intense heterogeneity FDG uptake at least 1 area of peritoneal nodularity is identified. There is also ascites within the lower abdomen and pelvis. Suspicious for peritoneal metastases. Borderline enlarged periaortic lymph nodes. Retroperitoneal nodal metastasis cannot be excluded. No evidence for osseous metastasis or  metastatic disease to the neck or chest. MRI brain was negative for metastases.  Her case was discussed at multidisciplinary conference and she was recommended to receive 3-4 cycles of BEP chemotherapy prior to reoperation and resection of the left ovary, omentum, nodes.  Cycle 1 of BEP was administered on 07/14/17. Cycle 2 was adminstered on 8/28-08/08/17. Cycle 3 was administered on 08/25/18.  She tolerated therapy well though had some acquired pancytopenia and neutropenia.  MRI on 09/16/17 showed: Resolution of hydronephrosis. Small left periaortic nodes remain, no pelvic sidewall adenopathy. Significant decrease in size of left pelvic mass, measuring 7.9x7.8x5.8cm. It is again intimately associated with the posterior uterine body and fundus. No dominant right ovarian mass.  No omental nodules. New complexity within the cul de sac fluid.   On 10/08/17 she underwent an interval surgery with ex lap, omentectomy, left salpingo-ophorectomy. The ovarian mass measured 11.5cm and was densely adherent to the posterior uterus. However, it was able to be freed from the uterine body without entry into the endometrial cavity. The right ovary was grossly normal and covered with filmy adhesions. There was no gross residual disease remaining at the completion of surgery.  Final pathology revealed only microscopic foci of viable yolk sac tumor <0.1cm in the left ovary. The omentum was negative.  She received 1 additional cycle of BEP (cycle 4) on 11/09/17.   Since surgery she has remained disease free.   Tumor markers were normal (HCG undetectable on 08/24/17 - prior to debulking surgery) and continued <1 s/p chemotherapy on 11/20/17.  02/12/18 HCG was <1  LDH was 165 (normal)  05/13/18: HCG was <1  LDH was 169 (normal)   11/25/18 HCG was <1 LDH was 156 (normal)  02/21/19 HCG was <1 LDH was 166  05/26/19 HCG was <1 LDH was 157  09/02/19 HCG was <1 LDH was 166  11/29/19 HCG was <1 LDH was  166  05/10/20 HCG was pending LDH was pending  Interval Hx:  She was prescribed nuvaring at the visit in Fall, 2020 and she likes this form of birth control.   The patient reported a single episode of unexplained syncope. This was in the middle of the day. It occurred when it was not hot. No associated neuro symptoms. She was seated when it happened and therefore did not injure herself. She had a pounding headache during and after for 24 hours.    Current Meds:  Outpatient Encounter Medications as of 05/10/2020  Medication Sig  . acetaminophen (TYLENOL) 500 MG tablet Take 1 tablet (500 mg total) by mouth every 6 (six) hours as needed.  . etonogestrel-ethinyl estradiol (NUVARING) 0.12-0.015 MG/24HR vaginal ring Insert vaginally and leave in place for 3 consecutive weeks, then remove and replace immediately with new ring  . ibuprofen (ADVIL,MOTRIN) 400 MG tablet Take 2 tablets (800 mg total) by mouth every 6 (six) hours as needed for fever.   No facility-administered encounter medications on file as of 05/10/2020.    Allergy: No Known Allergies  Social Hx:   Social History   Socioeconomic History  . Marital status: Single    Spouse name: Not on file  . Number of children: 0  . Years of education: Not  on file  . Highest education level: Not on file  Occupational History  . Occupation: Ship broker  Tobacco Use  . Smoking status: Never Smoker  . Smokeless tobacco: Never Used  Substance and Sexual Activity  . Alcohol use: No  . Drug use: No  . Sexual activity: Yes  Other Topics Concern  . Not on file  Social History Narrative  . Not on file   Social Determinants of Health   Financial Resource Strain:   . Difficulty of Paying Living Expenses:   Food Insecurity:   . Worried About Charity fundraiser in the Last Year:   . Arboriculturist in the Last Year:   Transportation Needs:   . Film/video editor (Medical):   Marland Kitchen Lack of Transportation (Non-Medical):   Physical Activity:    . Days of Exercise per Week:   . Minutes of Exercise per Session:   Stress:   . Feeling of Stress :   Social Connections:   . Frequency of Communication with Friends and Family:   . Frequency of Social Gatherings with Friends and Family:   . Attends Religious Services:   . Active Member of Clubs or Organizations:   . Attends Archivist Meetings:   Marland Kitchen Marital Status:   Intimate Partner Violence:   . Fear of Current or Ex-Partner:   . Emotionally Abused:   Marland Kitchen Physically Abused:   . Sexually Abused:     Past Surgical Hx:  Past Surgical History:  Procedure Laterality Date  . DEBULKING N/A 10/08/2017   Procedure: RADICAL TUMOR DEBULKING;  Surgeon: Everitt Amber, MD;  Location: [facility];  Service: Gynecology;  Laterality: N/A;  . IR FLUORO GUIDE PORT INSERTION RIGHT  06/29/2017  . IR REMOVAL TUN ACCESS W/ PORT W/O FL MOD SED  12/21/2017  . IR US GUIDE VASC ACCESS RIGHT  06/29/2017  . LAPAROTOMY N/A 06/18/2017   Procedure: EXPLORATORY LAPAROTOMY, BIOPSY OF PELVIC MASS;  Surgeon: Everitt Amber, MD;  Location: [facility];  Service: Gynecology;  Laterality: N/A;  . LAPAROTOMY N/A 10/08/2017   Procedure: EXPLORATORY LAPAROTOMY; OMENTECTOMY;  Surgeon: Everitt Amber, MD;  Location: [facility];  Service: Gynecology;  Laterality: N/A;  . SALPINGOOPHORECTOMY Left 10/08/2017   Procedure: LEFT SALPINGO OOPHORECTOMY;  Surgeon: Everitt Amber, MD;  Location: [facility];  Service: Gynecology;  Laterality: Left;  . TONSILLECTOMY  2005    Past Medical Hx:  Past Medical History:  Diagnosis Date  . dysgerminoma of the ovary dx'd 2018   chemo comp 2018  . Migraine   . Pelvic mass in female     Past Gynecological History:  Sexually active. G0. Never had pap. No LMP recorded. Patient has had an injection.  Family Hx:  Family History  Problem Relation Age of Onset  . Hypertension Maternal Grandmother   . Heart disease Maternal Grandmother   . Diabetes Maternal Grandfather   . Lymphoma Maternal Grandfather 26        non-Hodgkin lymphoma    Review of Systems:  Constitutional  Feels well,   Occasional flushing. ENT Normal appearing ears and nares bilaterally Skin/Breast  No rash, sores, jaundice, itching, dryness Cardiovascular  No chest pain, shortness of breath, or edema  Pulmonary  No cough or wheeze.  Gastro Intestinal  No nausea, vomitting, or diarrhoea. No bright red blood per rectum, no abdominal pain, change in bowel movement, or constipation.  Genito Urinary  No frequency, urgency, dysuria, No abnormal bleeding Musculo Skeletal  No myalgia, arthralgia,  joint swelling or pain  Neurologic  No weakness, numbness, change in gait,  Psychology  No depression, anxiety, insomnia.   Vitals:  Blood pressure 121/60, pulse (!) 51, temperature 97.8 F (36.6 C), temperature source Tympanic, resp. rate 20, height 5' 6"  (1.676 m), weight 162 lb 12.8 oz (73.8 kg), SpO2 100 %.  Physical Exam: WD in NAD Neck  Supple NROM, without any enlargements.  Lymph Node Survey No supraclavicular or inguinal adenopathy. Cardiovascular  Pulse normal rate, regularity and rhythm. S1 and S2 normal.  Lungs  Clear to auscultation bilateraly, without wheezes/crackles/rhonchi. Good air movement.  Skin  No rash/lesions/breakdown  Psychiatry  Alert and oriented to person, place, and time  Abdomen  Normoactive bowel sounds, abdomen soft, non-tender and thin without evidence of hernia. Mass not palpable.  Back No CVA tenderness Genito Urinary: normal external female genitalia. Normal vaginal and cervix. Small mobile uterus and no palpable ovarian masses or cul de sac nodularity.  Rectal  deferred  Extremities  No bilateral cyanosis, clubbing or edema.   Thereasa Solo, MD  05/10/2020, 2:09 PM

## 2020-05-11 LAB — BETA HCG QUANT (REF LAB): hCG Quant: <1 m[IU]/mL

## 2020-05-17 ENCOUNTER — Encounter (HOSPITAL_COMMUNITY): Payer: Self-pay

## 2020-05-17 ENCOUNTER — Ambulatory Visit (HOSPITAL_COMMUNITY)
Admission: RE | Admit: 2020-05-17 | Discharge: 2020-05-17 | Disposition: A | Discharge status: Home / Self Care | Payer: BC Managed Care – PPO | Source: Ambulatory Visit | Attending: Gynecologic Oncology | Admitting: Gynecologic Oncology

## 2020-05-17 ENCOUNTER — Other Ambulatory Visit: Payer: Self-pay

## 2020-05-17 DIAGNOSIS — R519 Headache, unspecified: Secondary | ICD-10-CM | POA: Diagnosis not present

## 2020-05-17 DIAGNOSIS — R55 Syncope and collapse: Secondary | ICD-10-CM | POA: Insufficient documentation

## 2020-05-18 ENCOUNTER — Telehealth: Payer: Self-pay

## 2020-05-18 NOTE — Telephone Encounter (Signed)
Told Ms Mcgloin that the CT san showed a normal exam per Dr. Denman George. Pt verbalized understanding.

## 2020-05-28 ENCOUNTER — Encounter: Payer: Self-pay | Admitting: Gynecologic Oncology

## 2020-05-28 NOTE — Telephone Encounter (Signed)
Reviewed concerns noted in My chart message as well as office note 05-10-20 and CT scan 05-17-20  with Dr. Berline Lopes. Dr. Berline Lopes suggests that Ms Varas establish with a PCP to evaluate her symptoms. Pt verbalized understanding.

## 2020-07-27 ENCOUNTER — Other Ambulatory Visit: Payer: Self-pay | Admitting: Gynecologic Oncology

## 2020-07-27 DIAGNOSIS — C562 Malignant neoplasm of left ovary: Secondary | ICD-10-CM

## 2020-07-31 ENCOUNTER — Encounter: Payer: Self-pay | Admitting: Gynecologic Oncology

## 2020-07-31 ENCOUNTER — Other Ambulatory Visit: Payer: Self-pay | Admitting: Gynecologic Oncology

## 2020-07-31 DIAGNOSIS — C562 Malignant neoplasm of left ovary: Secondary | ICD-10-CM

## 2020-07-31 MED ORDER — ETONOGESTREL-ETHINYL ESTRADIOL 0.12-0.015 MG/24HR VA RING: VAGINAL_RING | VAGINAL | 6 refills | Status: DC

## 2020-07-31 NOTE — Progress Notes (Signed)
See mychart message.  Refill given for the nuvaring.

## 2020-11-27 ENCOUNTER — Encounter: Payer: Self-pay | Admitting: Gynecologic Oncology

## 2020-11-29 ENCOUNTER — Inpatient Hospital Stay: Payer: BC Managed Care – PPO | Attending: Gynecologic Oncology | Admitting: Gynecologic Oncology

## 2020-11-29 ENCOUNTER — Other Ambulatory Visit: Payer: Self-pay

## 2020-11-29 ENCOUNTER — Encounter: Payer: Self-pay | Admitting: Gynecologic Oncology

## 2020-11-29 ENCOUNTER — Inpatient Hospital Stay: Payer: BC Managed Care – PPO

## 2020-11-29 VITALS — BP 113/70 | HR 64 | Temp 98.4°F | Resp 17 | Ht 66.0 in | Wt 165.1 lb

## 2020-11-29 DIAGNOSIS — Z793 Long term (current) use of hormonal contraceptives: Secondary | ICD-10-CM | POA: Diagnosis not present

## 2020-11-29 DIAGNOSIS — Z9221 Personal history of antineoplastic chemotherapy: Secondary | ICD-10-CM | POA: Insufficient documentation

## 2020-11-29 DIAGNOSIS — C562 Malignant neoplasm of left ovary: Secondary | ICD-10-CM

## 2020-11-29 DIAGNOSIS — Z08 Encounter for follow-up examination after completed treatment for malignant neoplasm: Secondary | ICD-10-CM | POA: Insufficient documentation

## 2020-11-29 DIAGNOSIS — Z90722 Acquired absence of ovaries, bilateral: Secondary | ICD-10-CM | POA: Insufficient documentation

## 2020-11-29 DIAGNOSIS — Z8543 Personal history of malignant neoplasm of ovary: Secondary | ICD-10-CM

## 2020-11-29 DIAGNOSIS — M222X1 Patellofemoral disorders, right knee: Secondary | ICD-10-CM | POA: Diagnosis not present

## 2020-11-29 LAB — LACTATE DEHYDROGENASE: LDH: 162 U/L (ref 98–192)

## 2020-11-29 NOTE — Patient Instructions (Signed)
Please return to see Dr Denman George as scheduled in June.  If you need to change the date and time of this appointment as it nears closer, please call her office at [phone].  If you develop persistent abnormal bleeding, please call our office at the above number, and she will help schedule an ultrasound scan for you to evaluate the uterus.  We will contact you with your tumor markers that were drawn today.  We will redraw them when you return to see [place] in June.  All the best luck with transitioning to new school.

## 2020-11-29 NOTE — Progress Notes (Signed)
Follow-up Note: Gyn-Onc  Consult was initially requested by Mandy Gully, Mandy Bauer for the evaluation of Mandy Bauer 19 y.o. female  CC:  Chief Complaint  Patient presents with  . Dysgerminoma of left ovary [facility])    Assessment/Plan:  Mandy Bauer  is a 19 y.o.  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 Completed therapy December, 2018.  No evidence of disease on exam or tumor markers.  Will continue surveillance with 6 monthly tumor marker assessments (HCG and LDH).   I will see her back during summer break in June.  HPI: Mandy Bauer is a 19 year old P0 who was seen in consultation at the request of Mandy Gully, Mandy Bauer for a large pelvic mass.  The patient is otherwise very healthy. 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  In May, 2018 with her menstrual period she experienced severe flushing, headaches, palpitations, nausea. This recurred again in June and she was seen at an urgent care. She was recommended to see her OBGYN about starting OCP's for possible menstrual migraines. Her menstrual period was otherwise normal.  She saw Mandy Gully, Mandy Bauer on 06/08/17 and a mass was felt on pelvic examination. This prompted a TVUS to be performed. A large heterogeneous mass was noted extending from the posterior aspect of the uterus to above the umbilicus. Unable to decifer place of origin. Separate ovaries not seen. Increased vascularity noted within. Area measuring 21.5x15x12.7cm.    She then received an MRI of the abdomen and pelvis on 06/12/17 which revealed a small splenule along medial spleen. She also had a 20m and 11 mm left periaortic lymph nodes identified that were mildly enlarged.  Within the  pelvis and extending to the lower abdomen was a 17.8x15.5x7.5cm lobular intermediate T2 signal mass. The mass demonstrated heterogeneous enhancement centrally. No definite fat plane was seen between the mass and posterior uterus. The right ovary is identified and appeared separate to the mass measuring 5cm. The left ovary could not definitively be seen. There is a moderate amount of free fluid in the pelvis and additionally, there were a few enhancing nodules measuring up to 1.9cm in the cul de sac.  CEA was 1.5 CA 125 was 137  Alk Phos was elevated at 196 AFP was 1.2 (normal) HCG was elevated at 344 on 06/16/17 LDH was elevated at 634 on 06/16/17  On 06/18/17 she underwent an exploratory laparotomy, partial omentectomy, biopsy of left ovarian mass. Intraoperative findings included a 20+ cm solid left ovarian tumor densely adherent to the posterior uterus (infiltrating into the uterus/myometrium) with omentum attached anteriorally, mobile but enlarged left PA nodes, no other gross intraperitoneal disease. Due to the inability to resect the mass and preserve uterine fertility a decision was made to biopsy the mass. A segment of omentum that was adherent to the tumor was also removed.  Final pathology confirmed a mixed germ cell tumor with 98% dysgerminoma and 2% yolk sac tumor. The omentum was positive for nodules (microscopic) germ cell tumor. The washings/ascites were positive.  PET/CT on 06/26/17 showed very large pelvic mass compatible with germ cell neoplasm is again identified and exhibits intense heterogeneity FDG uptake at least 1 area of peritoneal nodularity is identified.  There is also ascites within the lower abdomen and pelvis. Suspicious for peritoneal metastases. Borderline enlarged periaortic lymph nodes. Retroperitoneal nodal metastasis cannot be excluded. No evidence for osseous metastasis or metastatic disease to the neck or chest. MRI brain was negative for metastases.  Her case was  discussed at multidisciplinary conference and she was recommended to receive 3-4 cycles of BEP chemotherapy prior to reoperation and resection of the left ovary, omentum, nodes.  Cycle 1 of BEP was administered on 07/14/17. Cycle 2 was adminstered on 8/28-08/08/17. Cycle 3 was administered on 08/25/18.  She tolerated therapy well though had some acquired pancytopenia and neutropenia.  MRI on 09/16/17 showed: Resolution of hydronephrosis. Small left periaortic nodes remain, no pelvic sidewall adenopathy. Significant decrease in size of left pelvic mass, measuring 7.9x7.8x5.8cm. It is again intimately associated with the posterior uterine body and fundus. No dominant right ovarian mass.  No omental nodules. New complexity within the cul de sac fluid.   On 10/08/17 she underwent an interval surgery with ex lap, omentectomy, left salpingo-ophorectomy. The ovarian mass measured 11.5cm and was densely adherent to the posterior uterus. However, it was able to be freed from the uterine body without entry into the endometrial cavity. The right ovary was grossly normal and covered with filmy adhesions. There was no gross residual disease remaining at the completion of surgery.  Final pathology revealed only microscopic foci of viable yolk sac tumor <0.1cm in the left ovary. The omentum was negative.  She received 1 additional cycle of BEP (cycle 4) on 11/09/17.   Since surgery she has remained disease free.   Tumor markers were normal (HCG undetectable on 08/24/17 - prior to debulking surgery) and continued <1 s/p chemotherapy on 11/20/17.  02/12/18 HCG was <1  LDH was 165 (normal)  05/13/18: HCG was <1  LDH was 169 (normal)   11/25/18 HCG was <1 LDH was 156 (normal)  02/21/19 HCG was <1 LDH was 166  05/26/19 HCG was <1 LDH was 157  09/02/19 HCG was <1 LDH was 166  11/29/19 HCG was <1 LDH was 166  05/10/20 HCG was <1 LDH was 175  11/29/20  HCG was pending LDH was pending  Interval Hx:   She was prescribed nuvaring at the visit in Fall, 2020 and she likes this form of birth control.   She was late in replacing it in November, 2021 and had prolonged bleeding for that month. 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  She is excited about this.  The parents are in the process of retiring and [place] but will remain living in [place] for the next 2 years.  She has no physical symptoms concerning for recurrence   Current Meds:  Outpatient Encounter Medications as of 11/29/2020  Medication Sig  . acetaminophen (TYLENOL) 500 MG tablet Take 1 tablet (500 mg total) by mouth every 6 (six) hours as needed.  . etonogestrel-ethinyl estradiol (NUVARING) 0.12-0.015 MG/24HR vaginal ring Insert vaginally and leave in place for 3 consecutive weeks, then remove and replace immediately with new ring  . NUVARING 0.12-0.015 MG/24HR vaginal ring INSERT 1 RING VAGINALLY AS DIRECTED. REMOVE AFTER 3 WEEKS & WAIT 7 DAYS BEFORE INSERTING A NEW RING  . [DISCONTINUED] ibuprofen (ADVIL,MOTRIN) 400 MG tablet Take 2 tablets (800 mg total) by mouth every 6 (six) hours as needed for fever.   No  facility-administered encounter medications on file as of 11/29/2020.    Allergy: No Known Allergies  Social Hx:   Social History   Socioeconomic History  . Marital status: Single    Spouse name: Not on file  . Number of children: 0  . Years of education: Not on file  . Highest education level: Not on file  Occupational History  . Occupation: Ship broker  Tobacco Use  . Smoking status: Never Smoker  . Smokeless tobacco: Never Used  Vaping Use  . Vaping Use: Never used  Substance and Sexual Activity  . Alcohol use: No  . Drug use: No  . Sexual activity: Yes  Other Topics Concern  . Not on file  Social History Narrative  . Not on file   Social  Determinants of Health   Financial Resource Strain: Not on file  Food Insecurity: Not on file  Transportation Needs: Not on file  Physical Activity: Not on file  Stress: Not on file  Social Connections: Not on file  Intimate Partner Violence: Not on file    Past Surgical Hx:  Past Surgical History:  Procedure Laterality Date  . DEBULKING N/A 10/08/2017   Procedure: RADICAL TUMOR DEBULKING;  Surgeon: Everitt Amber, MD;  Location: [facility];  Service: Gynecology;  Laterality: N/A;  . IR FLUORO GUIDE PORT INSERTION RIGHT  06/29/2017  . IR REMOVAL TUN ACCESS W/ PORT W/O FL MOD SED  12/21/2017  . IR US GUIDE VASC ACCESS RIGHT  06/29/2017  . LAPAROTOMY N/A 06/18/2017   Procedure: EXPLORATORY LAPAROTOMY, BIOPSY OF PELVIC MASS;  Surgeon: Everitt Amber, MD;  Location: [facility];  Service: Gynecology;  Laterality: N/A;  . LAPAROTOMY N/A 10/08/2017   Procedure: EXPLORATORY LAPAROTOMY; OMENTECTOMY;  Surgeon: Everitt Amber, MD;  Location: [facility];  Service: Gynecology;  Laterality: N/A;  . SALPINGOOPHORECTOMY Left 10/08/2017   Procedure: LEFT SALPINGO OOPHORECTOMY;  Surgeon: Everitt Amber, MD;  Location: [facility];  Service: Gynecology;  Laterality: Left;  . TONSILLECTOMY  2005    Past Medical Hx:  Past Medical History:  Diagnosis Date  . dysgerminoma of the ovary dx'd 2018   chemo comp 2018  . Migraine   . Pelvic mass in female     Past Gynecological History:  Sexually active. G0. Never had pap. No LMP recorded.  Family Hx:  Family History  Problem Relation Age of Onset  . Hypertension Maternal Grandmother   . Heart disease Maternal Grandmother   . Diabetes Maternal Grandfather   . Lymphoma Maternal Grandfather 63       non-Hodgkin lymphoma    Review of Systems:  Constitutional  Feels well,   Occasional flushing. ENT Normal appearing ears and nares bilaterally Skin/Breast  No rash, sores, jaundice, itching, dryness Cardiovascular  No chest pain, shortness of breath, or edema  Pulmonary  No  cough or wheeze.  Gastro Intestinal  No nausea, vomitting, or diarrhoea. No bright red blood per rectum, no abdominal pain, change in bowel movement, or constipation.  Genito Urinary  No frequency, urgency, dysuria, No abnormal bleeding Musculo Skeletal  No myalgia, arthralgia, joint swelling or pain  Neurologic  No weakness, numbness, change in gait,  Psychology  No depression, anxiety, insomnia.   Vitals:  Blood pressure 113/70, pulse 64, temperature 98.4 F (36.9 C), temperature source Tympanic, resp. rate 17, height 5' 6"  (1.676 m), weight 165 lb 1.6 oz (74.9 kg), SpO2 100 %.  Physical Exam: WD in NAD Neck  Supple NROM, without any enlargements.  Lymph Node Survey  No supraclavicular or inguinal adenopathy. Cardiovascular  Pulse normal rate, regularity and rhythm. S1 and S2 normal.  Lungs  Clear to auscultation bilateraly, without wheezes/crackles/rhonchi. Good air movement.  Skin  No rash/lesions/breakdown  Psychiatry  Alert and oriented to person, place, and time  Abdomen  Normoactive bowel sounds, abdomen soft, non-tender and thin without evidence of hernia. Mass not palpable.  Back No CVA tenderness Genito Urinary: normal external female genitalia. Normal vaginal and cervix. Small mobile uterus and no palpable ovarian masses or cul de sac nodularity.  Rectal  deferred  Extremities  No bilateral cyanosis, clubbing or edema.   Thereasa Solo, MD  11/29/2020, 2:29 PM

## 2020-11-30 LAB — BETA HCG QUANT (REF LAB): hCG Quant: <1 m[IU]/mL

## 2021-03-14 ENCOUNTER — Other Ambulatory Visit: Payer: Self-pay

## 2021-03-14 DIAGNOSIS — C562 Malignant neoplasm of left ovary: Secondary | ICD-10-CM

## 2021-03-14 MED ORDER — ETONOGESTREL-ETHINYL ESTRADIOL 0.12-0.015 MG/24HR VA RING: VAGINAL_RING | VAGINAL | 6 refills | Status: DC

## 2021-03-14 NOTE — Progress Notes (Signed)
Pt called stating that her insurance will not fill the Nuvaring today as it is too early for refill. Pharmacy has prescription with directions of removal  After 3 weeks and wait 1 week for reinsertion. Gave updated directions below : Insert vaginally and leave in place for 3 consecutive weeks, then remove and replace immediately  with new ring           Told Ms Ballow that she needed to call the pharmacy in an hour and see if prescription was approved by insurance.  If not call the office back.

## 2021-03-14 NOTE — Progress Notes (Signed)
Prescription sent to [facility] on [address] in [place]. Mother Hassan Rowan notified.

## 2021-05-27 ENCOUNTER — Inpatient Hospital Stay: Payer: BC Managed Care – PPO

## 2021-05-27 ENCOUNTER — Inpatient Hospital Stay: Payer: BC Managed Care – PPO | Attending: Gynecologic Oncology | Admitting: Gynecologic Oncology

## 2021-05-27 ENCOUNTER — Other Ambulatory Visit: Payer: Self-pay

## 2021-05-27 VITALS — BP 124/68 | HR 65 | Temp 97.3°F | Resp 16 | Ht 66.0 in | Wt 163.9 lb

## 2021-05-27 DIAGNOSIS — Z8543 Personal history of malignant neoplasm of ovary: Secondary | ICD-10-CM | POA: Diagnosis present

## 2021-05-27 DIAGNOSIS — Z9221 Personal history of antineoplastic chemotherapy: Secondary | ICD-10-CM | POA: Insufficient documentation

## 2021-05-27 DIAGNOSIS — C562 Malignant neoplasm of left ovary: Secondary | ICD-10-CM

## 2021-05-27 DIAGNOSIS — Z308 Encounter for other contraceptive management: Secondary | ICD-10-CM

## 2021-05-27 DIAGNOSIS — N946 Dysmenorrhea, unspecified: Secondary | ICD-10-CM

## 2021-05-27 LAB — LACTATE DEHYDROGENASE: LDH: 158 U/L (ref 98–192)

## 2021-05-27 MED ORDER — ETONOGESTREL-ETHINYL ESTRADIOL 0.12-0.015 MG/24HR VA RING: VAGINAL_RING | VAGINAL | 6 refills | Status: AC

## 2021-05-27 NOTE — Progress Notes (Addendum)
Follow-up Note: Gyn-Onc  Consult was initially requested by Avon Gully, NP for the evaluation of Mandy Bauer 20 y.o. female  CC:  Chief Complaint  Patient presents with   Dysgerminoma of left ovary [facility])    Assessment/Plan:  [name]  is a 20 y.o.  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 Completed therapy December, 2018.  No evidence of disease on exam or tumor markers.  Will continue surveillance with 6 monthly tumor marker assessments (HCG and LDH).   She should be seen in December for repeat evaluation. She will follow-up with Joylene John for this.  I informed the patient that I will be leaving the practice in the fall.  I represcribed her Nuvaring which is to be used continuously.   HPI: Mandy Bauer is a 20 year old P0 who was initially seen in consultation at the request of Avon Gully, NP for a large pelvic mass. 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  In May, 2018 with her menstrual period she experienced severe flushing, headaches, palpitations, nausea. This recurred again in June and she was seen at an urgent care. She was recommended to see her OBGYN about starting OCP's for possible menstrual migraines.   She saw Avon Gully, NP on 06/08/17 and a mass was felt on pelvic examination. This prompted a TVUS to be performed. A large heterogeneous mass was noted extending from the posterior aspect of the uterus to above the umbilicus. The scan was unable to decifer the place of origin. Separate ovaries were not seen. It showed increased vascularity to an area measuring 21.5x15x12.7cm.    She then received an MRI of the abdomen and pelvis on 06/12/17 which revealed a small splenule along  medial spleen. She also had a 32m and 11 mm left periaortic lymph nodes identified that were mildly enlarged. Within the pelvis and extending to the lower abdomen was a 17.8x15.5x7.5cm lobular intermediate T2 signal mass. The mass demonstrated heterogeneous enhancement centrally. No definite fat plane was seen between the mass and posterior uterus. The right ovary was identified and appeared separate to the mass measuring 5cm. The left ovary could not definitively be seen. There was a moderate amount of free fluid in the pelvis and additionally, there were a few enhancing nodules measuring up to 1.9cm in the cul de sac.  CEA was 1.5 CA 125 was 137  Alk Phos was elevated at 196 AFP was 1.2 (normal) HCG was elevated at 344 on 06/16/17 LDH was elevated at 634 on 06/16/17  On 06/18/17 she underwent an exploratory laparotomy, partial omentectomy, biopsy of left ovarian mass. Intraoperative findings included a 20+ cm solid left ovarian tumor densely adherent to the posterior uterus (infiltrating into the uterus/myometrium) with omentum attached anteriorally, mobile but enlarged left PA nodes, no other gross intraperitoneal disease. Due to the inability to resect the mass and preserve uterine fertility a decision was made to biopsy the mass. A segment of omentum that was adherent to the tumor was also removed.  Final pathology confirmed a mixed germ cell tumor with 98% dysgerminoma and 2% yolk sac tumor. The omentum was positive for nodules (microscopic) germ cell tumor. The washings/ascites were positive.  Postoperative PET/CT on 06/26/17 showed very large  pelvic mass compatible with germ cell neoplasm is again identified and exhibits intense heterogeneity FDG uptake at least 1 area of peritoneal nodularity is identified. There was also ascites within the lower abdomen and pelvis, suspicious for peritoneal metastases. It identified borderline enlarged periaortic lymph nodes, retroperitoneal nodal metastasis  could not be excluded.   An MRI of the brain was negative for metastases.  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  Cycle 1 of BEP was administered on 07/14/17. Cycle 2 was adminstered on 8/28-08/08/17. Cycle 3 was administered on 22/18/19.  She tolerated therapy well though had some acquired pancytopenia and neutropenia.  MRI on 09/16/17 showed: resolution of hydronephrosis, mall left periaortic nodes remaining, no pelvic sidewall adenopathy, and significant decrease in the size of the left pelvic mass, measuring 7.9x7.8x5.8cm. It was again intimately associated with the posterior uterine body and fundus.   On 10/08/17 she underwent an interval surgery with ex lap, omentectomy, left salpingo-ophorectomy. The ovarian mass measured 11.5cm and was densely adherent to the posterior uterus. However, it was able to be freed from the uterine body without entry into the endometrial cavity. The right ovary was grossly normal and covered with filmy adhesions. There was no gross residual disease remaining at the completion of surgery.  Final pathology revealed only microscopic foci of viable yolk sac tumor <0.1cm in the left ovary. The omentum was negative.  She received 1 additional cycle of BEP (cycle 4) on 11/09/17.   Since surgery she has remained disease free.   Tumor markers were normal (HCG undetectable on 08/24/17 - prior to debulking surgery) and continued <1 s/p chemotherapy on 11/20/17.  02/12/18 HCG was <1  LDH was 165 (normal)  05/13/18: HCG was <1  LDH was 169 (normal)   11/25/18 HCG was <1 LDH was 156 (normal)  02/21/19 HCG was <1 LDH was 166 (normal)  05/26/19 HCG was <1 LDH was 157 (normal)  09/02/19 HCG was <1 LDH was 166 (normal)  11/29/19 HCG was <1 LDH was  166 (normal)  05/10/20 HCG was <1 LDH was 175  (normal)  11/29/20  HCG was <1 LDH was 162  (normal)  05/27/21 HCG was pending LDH was 158  (normal)  Interval Hx:  She was prescribed nuvaring at the visit in Fall, 2020 and she likes this form of birth control. This is important to prevent pregnancy during her surveillance period given the importance of HCG monitoring as part of her tumor marker profile.  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  Her parents are in the process of retiring and relocating to the mountains but will remain living in [place] for the next 2 years.  She has no physical symptoms concerning for recurrence.  She has difficulty getting the nuvaring covered by insurance due to the continuous dosing regimen she is on (replacing at 3 weekly intervals instead of 4 weekly intervals).    Current Meds:  Outpatient Encounter Medications as of 05/27/2021  Medication Sig   etonogestrel-ethinyl estradiol (NUVARING) 0.12-0.015 MG/24HR vaginal ring Insert vaginally and leave in place for 3 consecutive weeks, then remove and replace immediately with new ring   [DISCONTINUED] etonogestrel-ethinyl estradiol (NUVARING) 0.12-0.015 MG/24HR vaginal ring Insert vaginally and leave in place for 3 consecutive weeks,  then remove and replace immediately with new ring   No facility-administered encounter medications on file as of 05/27/2021.    Allergy: No Known Allergies  Social Hx:   Social History   Socioeconomic History   Marital status: Single    Spouse name: Not on file   Number of children: 0   Years of education: Not on file   Highest education level: Not on file  Occupational History   Occupation: student  Tobacco Use   Smoking status: Never   Smokeless tobacco: Never  Vaping Use   Vaping Use: Never used  Substance and Sexual Activity   Alcohol use: No   Drug use: No   Sexual activity: Yes  Other Topics  Concern   Not on file  Social History Narrative   Not on file   Social Determinants of Health   Financial Resource Strain: Not on file  Food Insecurity: Not on file  Transportation Needs: Not on file  Physical Activity: Not on file  Stress: Not on file  Social Connections: Not on file  Intimate Partner Violence: Not on file    Past Surgical Hx:  Past Surgical History:  Procedure Laterality Date   DEBULKING N/A 10/08/2017   Procedure: RADICAL TUMOR DEBULKING;  Surgeon: Everitt Amber, MD;  Location: [facility];  Service: Gynecology;  Laterality: N/A;   IR FLUORO GUIDE PORT INSERTION RIGHT  06/29/2017   IR REMOVAL TUN ACCESS W/ PORT W/O FL MOD SED  12/21/2017   IR US GUIDE VASC ACCESS RIGHT  06/29/2017   LAPAROTOMY N/A 06/18/2017   Procedure: EXPLORATORY LAPAROTOMY, BIOPSY OF PELVIC MASS;  Surgeon: Everitt Amber, MD;  Location: [facility];  Service: Gynecology;  Laterality: N/A;   LAPAROTOMY N/A 10/08/2017   Procedure: EXPLORATORY LAPAROTOMY; OMENTECTOMY;  Surgeon: Everitt Amber, MD;  Location: [facility];  Service: Gynecology;  Laterality: N/A;   SALPINGOOPHORECTOMY Left 10/08/2017   Procedure: LEFT SALPINGO OOPHORECTOMY;  Surgeon: Everitt Amber, MD;  Location: [facility];  Service: Gynecology;  Laterality: Left;   TONSILLECTOMY  2005    Past Medical Hx:  Past Medical History:  Diagnosis Date   dysgerminoma of the ovary dx'd 2018   chemo comp 2018   Migraine    Pelvic mass in female     Past Gynecological History:  Sexually active. G0. Never had pap. No LMP recorded.  Family Hx:  Family History  Problem Relation Age of Onset   Hypertension Maternal Grandmother    Heart disease Maternal Grandmother    Diabetes Maternal Grandfather    Lymphoma Maternal Grandfather 46       non-Hodgkin lymphoma    Review of Systems:  Constitutional  Feels well,   Occasional flushing. ENT Normal appearing ears and nares bilaterally Skin/Breast  No rash, sores, jaundice, itching, dryness Cardiovascular  No  chest pain, shortness of breath, or edema  Pulmonary  No cough or wheeze.  Gastro Intestinal  No nausea, vomitting, or diarrhoea. No bright red blood per rectum, no abdominal pain, change in bowel movement, or constipation.  Genito Urinary  No frequency, urgency, dysuria, No abnormal bleeding Musculo Skeletal  No myalgia, arthralgia, joint swelling or pain  Neurologic  No weakness, numbness, change in gait,  Psychology  No depression, anxiety, insomnia.   Vitals:  Blood pressure 124/68, pulse 65, temperature (!) 97.3 F (36.3 C), temperature source Tympanic, resp. rate 16, height 5' 6"  (1.676 m), weight 163 lb 14.4 oz (74.3 kg), SpO2 100 %.  Physical Exam: WD in NAD Neck  Supple NROM, without any enlargements.  Lymph Node Survey No supraclavicular or inguinal adenopathy. Cardiovascular  Pulse normal rate, regularity and rhythm. S1 and S2 normal.  Lungs  Clear to auscultation bilateraly, without wheezes/crackles/rhonchi. Good air movement.  Skin  No rash/lesions/breakdown  Psychiatry  Alert and oriented to person, place, and time  Abdomen  Normoactive bowel sounds, abdomen soft, non-tender and thin without evidence of hernia. Mass not palpable.  Back No CVA tenderness Genito Urinary: normal external female genitalia. Normal vaginal and cervix. Small mobile uterus and no palpable ovarian masses or cul de sac nodularity.  Rectal  deferred  Extremities  No bilateral cyanosis, clubbing or edema.   Thereasa Solo, MD  05/27/2021, 5:50 PM

## 2021-05-27 NOTE — Patient Instructions (Signed)
Please notify Dr Denman George at phone number [phone] if you notice vaginal bleeding, new pelvic or abdominal pains, bloating, feeling full easy, or a change in bladder or bowel function.   Dr Denman George is departing the [place] at [facility] in October, 2022. Her partners and colleagues including Dr Berline Lopes, Dr Delsa Sale and Joylene John, Nurse Practitioner will be available to continue your care.   You are next scheduled to return to the Gynecologic Oncology office at the [facility] in December with [facility]. Tumor markers will be drawn then.

## 2021-05-28 LAB — BETA HCG QUANT (REF LAB): hCG Quant: <1 m[IU]/mL

## 2021-12-04 NOTE — Patient Instructions (Signed)
We will contact you with your tumor markers that were drawn today and will plan to redraw them when you return to see [place] in June 2023.  Plan to follow up in June 2023 or sooner if needed. If you need to change the date and time of an appointment as it nears closer, please call her office at [phone].   Symptoms to report to your health care team [phone]) include vaginal bleeding, rectal bleeding, bloating/feeling full quicker, weight loss without effort, new and persistent pain, new and  persistent fatigue, new leg swelling, new masses (i.e., bumps in your neck or groin), new and persistent cough, new and persistent nausea and vomiting, change in bowel or bladder habits, persistent abnormal bleeding, and any other concerns.

## 2021-12-04 NOTE — Progress Notes (Unsigned)
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 is a 20 y.o. 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 She completed therapy December, 2018.  No evidence of disease on exam. Tumor markers drawn today pending.  Recommendations include continued surveillance with 6 monthly tumor marker assessments (HCG and LDH). She is advised to follow up in June 2023 or sooner if needs/symptoms arise. She will continue with nuvaring use continuously.   HPI: Mandy Bauer is a 20 year old female P0 initially seen in consultation at the request of Mandy Gully, NP for a large pelvic mass.  In May, 2018, during her menstrual period, she began experiencing severe flushing, headaches, palpitations, nausea. This recurred again in June and she was seen at an urgent care. She was recommended to see her OBGYN about starting OCP's for possible menstrual migraines. Her menstrual period was otherwise normal.  She saw Mandy Gully, NP on 06/08/17 and a mass was felt on pelvic examination. This prompted a TVUS to be performed. A large heterogeneous mass was noted extending from the posterior aspect of the uterus to above the umbilicus. Unable to decifer place of origin. Separate ovaries not seen. Increased vascularity noted within. Area measuring 21.5x15x12.7cm.    She then received an MRI of the abdomen and pelvis on 06/12/17 which revealed a small splenule along medial spleen. She also had a 61m and 11 mm left periaortic lymph nodes identified that were mildly enlarged.  Within the pelvis and extending to the lower abdomen, there was a 17.8x15.5x7.5cm lobular  intermediate T2 signal mass. The mass demonstrated heterogeneous enhancement centrally. No definite fat plane was seen between the mass and posterior uterus. The right ovary is identified and appeared separate to the mass measuring 5cm. The left ovary could not definitively be seen. There is a moderate amount of free fluid in the pelvis and additionally, there were a few enhancing nodules measuring up to 1.9cm in the cul de sac.  CEA was 1.5 CA 125 was 137  Alk Phos was elevated at 196 AFP was 1.2 (normal) HCG was elevated at 344 on 06/16/17 LDH was elevated at 634 on 06/16/17  On 06/18/17, she underwent an exploratory laparotomy, partial omentectomy, biopsy of left ovarian mass. Intraoperative findings included a 20+ cm solid left ovarian tumor densely adherent to the posterior uterus (infiltrating into the uterus/myometrium) with omentum attached anteriorally, mobile but enlarged left PA nodes, no other gross intraperitoneal disease. Due to the inability to resect the mass and preserve uterine fertility a decision was made to biopsy the mass. A segment of omentum that was adherent to the tumor was also removed.  Final pathology confirmed a mixed germ cell tumor with 98% dysgerminoma and 2% yolk sac tumor. The omentum was positive for nodules (microscopic) germ cell tumor. The washings/ascites were positive.  PET/CT on 06/26/17 showed very large pelvic mass compatible with germ cell neoplasm is again identified and exhibits intense heterogeneity FDG uptake at least 1 area of peritoneal nodularity is identified. There is also ascites within the lower abdomen and pelvis. Suspicious for peritoneal metastases. Borderline enlarged periaortic lymph nodes. Retroperitoneal nodal metastasis cannot be excluded.  No evidence for osseous metastasis or metastatic disease to the neck or chest. MRI brain was negative for metastases.  Her case was discussed at multidisciplinary conference and she was recommended to  receive 3-4 cycles of BEP chemotherapy prior to reoperation and resection of the left ovary, omentum, nodes.  Cycle 1 of BEP was administered on 07/14/17. Cycle 2 was adminstered on 8/28-08/08/17. Cycle 3 was administered on 08/25/18.  She tolerated therapy well though had some acquired pancytopenia and neutropenia.  MRI on 09/16/17 showed: Resolution of hydronephrosis. Small left periaortic nodes remain, no pelvic sidewall adenopathy. Significant decrease in size of left pelvic mass, measuring 7.9x7.8x5.8cm. It is again intimately associated with the posterior uterine body and fundus. No dominant right ovarian mass.  No omental nodules. New complexity within the cul de sac fluid.   On 10/08/17 she underwent an interval surgery with ex lap, omentectomy, left salpingo-ophorectomy. The ovarian mass measured 11.5cm and was densely adherent to the posterior uterus. However, it was able to be freed from the uterine body without entry into the endometrial cavity. The right ovary was grossly normal and covered with filmy adhesions. There was no gross residual disease remaining at the completion of surgery.  Final pathology revealed only microscopic foci of viable yolk sac tumor <0.1cm in the left ovary. The omentum was negative.  She received 1 additional cycle of BEP (cycle 4) on 11/09/17.   Since surgery she has remained disease free.   Tumor markers were normal (HCG undetectable on 08/24/17 - prior to debulking surgery) and continued <1 s/p chemotherapy on 11/20/17.  02/12/18 HCG was <1  LDH was 165 (normal)  05/13/18: HCG was <1  LDH was 169 (normal)   11/25/18 HCG was <1 LDH was 156 (normal)  02/21/19 HCG was <1 LDH was 166  05/26/19 HCG was <1 LDH was 157  09/02/19 HCG was <1 LDH was 166  11/29/19 HCG was <1 LDH was 166  05/10/20 HCG was <1 LDH was 175  11/29/20  HCG was <1 LDH was 162  05/27/2021 HCG was <1 LDH was 158  Interval Hx:  She was prescribed nuvaring at the  visit in Fall, 2020 and she likes this form of birth control.   She was late in replacing it in November, 2021 and had prolonged bleeding for that month. 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  She is excited about this.  The parents are in the process of retiring and [place] but will remain living in [place] for the next 2 years.  She has no physical symptoms concerning for recurrence  Current Meds:  Outpatient Encounter Medications as of 12/05/2021  Medication Sig   etonogestrel-ethinyl estradiol (NUVARING) 0.12-0.015 MG/24HR vaginal ring Insert vaginally and leave in place for 3 consecutive weeks, then remove and replace immediately with new ring   No facility-administered encounter medications on file as of 12/05/2021.    Allergy: No Known Allergies  Social Hx:   Social History   Socioeconomic History   Marital status: Single    Spouse name: Not on file   Number of children: 0   Years of education: Not on file   Highest education level: Not on file  Occupational History   Occupation: student  Tobacco Use   Smoking status: Never   Smokeless tobacco: Never  Vaping Use   Vaping Use: Never  used  Substance and Sexual Activity   Alcohol use: No   Drug use: No   Sexual activity: Yes  Other Topics Concern   Not on file  Social History Narrative   Not on file   Social Determinants of Health   Financial Resource Strain: Not on file  Food Insecurity: Not on file  Transportation Needs: Not on file  Physical Activity: Not on file  Stress: Not on file  Social Connections: Not on file  Intimate Partner Violence: Not on file    Past Surgical Hx:  Past Surgical History:  Procedure Laterality Date   DEBULKING N/A 10/08/2017   Procedure: RADICAL TUMOR DEBULKING;  Surgeon: Everitt Amber, MD;  Location: [facility];  Service:  Gynecology;  Laterality: N/A;   IR FLUORO GUIDE PORT INSERTION RIGHT  06/29/2017   IR REMOVAL TUN ACCESS W/ PORT W/O FL MOD SED  12/21/2017   IR US GUIDE VASC ACCESS RIGHT  06/29/2017   LAPAROTOMY N/A 06/18/2017   Procedure: EXPLORATORY LAPAROTOMY, BIOPSY OF PELVIC MASS;  Surgeon: Everitt Amber, MD;  Location: [facility];  Service: Gynecology;  Laterality: N/A;   LAPAROTOMY N/A 10/08/2017   Procedure: EXPLORATORY LAPAROTOMY; OMENTECTOMY;  Surgeon: Everitt Amber, MD;  Location: [facility];  Service: Gynecology;  Laterality: N/A;   SALPINGOOPHORECTOMY Left 10/08/2017   Procedure: LEFT SALPINGO OOPHORECTOMY;  Surgeon: Everitt Amber, MD;  Location: [facility];  Service: Gynecology;  Laterality: Left;   TONSILLECTOMY  2005    Past Medical Hx:  Past Medical History:  Diagnosis Date   dysgerminoma of the ovary dx'd 2018   chemo comp 2018   Migraine    Pelvic mass in female     Past Gynecological History:  Sexually active. G0. Never had pap. No LMP recorded.  Family Hx:  Family History  Problem Relation Age of Onset   Hypertension Maternal Grandmother    Heart disease Maternal Grandmother    Diabetes Maternal Grandfather    Lymphoma Maternal Grandfather 65       non-Hodgkin lymphoma    Review of Systems:  Constitutional  Feels well,   Occasional flushing. ENT Normal appearing ears and nares bilaterally Skin/Breast  No rash, sores, jaundice, itching, dryness Cardiovascular  No chest pain, shortness of breath, or edema  Pulmonary  No cough or wheeze.  Gastro Intestinal  No nausea, vomitting, or diarrhoea. No bright red blood per rectum, no abdominal pain, change in bowel movement, or constipation.  Genito Urinary  No frequency, urgency, dysuria, No abnormal bleeding Musculo Skeletal  No myalgia, arthralgia, joint swelling or pain  Neurologic  No weakness, numbness, change in gait,  Psychology  No depression, anxiety, insomnia.   Vitals:  There were no vitals taken for this visit.  Physical  Exam: WD in NAD Neck  Supple NROM, without any enlargements.  Lymph Node Survey No supraclavicular or inguinal adenopathy. Cardiovascular  Pulse normal rate, regularity and rhythm. S1 and S2 normal.  Lungs  Clear to auscultation bilateraly, without wheezes/crackles/rhonchi. Good air movement.  Skin  No rash/lesions/breakdown  Psychiatry  Alert and oriented to person, place, and time  Abdomen  Normoactive bowel sounds, abdomen soft, non-tender and thin without evidence of hernia. Mass not palpable.  Back No CVA tenderness Genito Urinary: normal external female genitalia. Normal vaginal and cervix. Small mobile uterus and no palpable ovarian masses or cul de sac nodularity.  Rectal  deferred  Extremities  No bilateral cyanosis, clubbing or edema.   Dorothyann Gibbs, NP  12/04/2021, 12:26 PM

## 2021-12-05 ENCOUNTER — Inpatient Hospital Stay: Payer: BC Managed Care – PPO

## 2021-12-05 ENCOUNTER — Inpatient Hospital Stay: Payer: BC Managed Care – PPO | Attending: Gynecologic Oncology | Admitting: Gynecologic Oncology

## 2021-12-05 ENCOUNTER — Other Ambulatory Visit: Payer: BC Managed Care – PPO

## 2022-04-22 ENCOUNTER — Telehealth: Payer: Self-pay

## 2022-04-22 NOTE — Telephone Encounter (Signed)
Office received a fax from [facility] for a prescription refill for patients nuvaring. Attempted to call and follow up with patient regarding refill. She is overdue for an appointment with our office. Left message requesting return call.  ?

## 2022-04-23 NOTE — Telephone Encounter (Signed)
Spoke with pt this morning who stated that she's seeing Dr.Rossi at her new office. Informed pt that she can ask them to refill her medication because she is seeing them for her care. Pt verbalized understanding.  ?
